# Patient Record
Sex: Male | Born: 1985 | Race: Black or African American | Hispanic: No | Marital: Single | Smoking: Never smoker
Health system: Southern US, Community
[De-identification: ages and names within clinical notes are randomized; demographics above are authoritative.]

## PROBLEM LIST (undated history)

## (undated) DIAGNOSIS — Z8669 Personal history of other diseases of the nervous system and sense organs: Secondary | ICD-10-CM

## (undated) HISTORY — DX: Personal history of other diseases of the nervous system and sense organs: Z86.69

---

## 2017-04-17 ENCOUNTER — Emergency Department (HOSPITAL_COMMUNITY): Payer: Self-pay

## 2017-04-17 ENCOUNTER — Emergency Department (HOSPITAL_COMMUNITY)
Admission: EM | Admit: 2017-04-17 | Discharge: 2017-04-17 | Disposition: A | Discharge status: Home / Self Care | Payer: Self-pay | Attending: Physician Assistant | Admitting: Physician Assistant

## 2017-04-17 ENCOUNTER — Encounter (HOSPITAL_COMMUNITY): Payer: Self-pay | Admitting: Emergency Medicine

## 2017-04-17 DIAGNOSIS — N200 Calculus of kidney: Secondary | ICD-10-CM | POA: Insufficient documentation

## 2017-04-17 LAB — URINALYSIS, ROUTINE W REFLEX MICROSCOPIC
BACTERIA UA: NONE SEEN
BILIRUBIN URINE: NEGATIVE
GLUCOSE, UA: NEGATIVE mg/dL
KETONES UR: NEGATIVE mg/dL
NITRITE: NEGATIVE
PROTEIN: 100 mg/dL — AB
Specific Gravity, Urine: 1.03 (ref 1.005–1.030)
pH: 5 (ref 5.0–8.0)

## 2017-04-17 LAB — COMPREHENSIVE METABOLIC PANEL
ALBUMIN: 4.3 g/dL (ref 3.5–5.0)
ALT: 18 U/L (ref 17–63)
ANION GAP: 9 (ref 5–15)
AST: 23 U/L (ref 15–41)
Alkaline Phosphatase: 67 U/L (ref 38–126)
BUN: 13 mg/dL (ref 6–20)
CO2: 24 mmol/L (ref 22–32)
Calcium: 9.2 mg/dL (ref 8.9–10.3)
Chloride: 106 mmol/L (ref 101–111)
Creatinine, Ser: 1.31 mg/dL — ABNORMAL HIGH (ref 0.61–1.24)
GFR calc non Af Amer: >60 mL/min (ref >60)
GLUCOSE: 142 mg/dL — AB (ref 65–99)
POTASSIUM: 3.5 mmol/L (ref 3.5–5.1)
SODIUM: 139 mmol/L (ref 135–145)
TOTAL PROTEIN: 7.6 g/dL (ref 6.5–8.1)
Total Bilirubin: 0.8 mg/dL (ref 0.3–1.2)

## 2017-04-17 LAB — CBC
HEMATOCRIT: 42.9 % (ref 39.0–52.0)
HEMOGLOBIN: 14.6 g/dL (ref 13.0–17.0)
MCH: 32.2 pg (ref 26.0–34.0)
MCHC: 34 g/dL (ref 30.0–36.0)
MCV: 94.7 fL (ref 78.0–100.0)
Platelets: 214 10*3/uL (ref 150–400)
RBC: 4.53 MIL/uL (ref 4.22–5.81)
RDW: 12.5 % (ref 11.5–15.5)
WBC: 13.4 10*3/uL — ABNORMAL HIGH (ref 4.0–10.5)

## 2017-04-17 LAB — LIPASE, BLOOD: Lipase: 25 U/L (ref 11–51)

## 2017-04-17 MED ORDER — ONDANSETRON 4 MG PO TBDP: 4.0000 mg | ORAL_TABLET | Freq: Three times a day (TID) | ORAL | 0 refills | Status: DC | PRN

## 2017-04-17 MED ORDER — ONDANSETRON HCL 4 MG/2ML IJ SOLN
4.0000 mg | Freq: Once | INTRAMUSCULAR | Status: AC
  Administered 2017-04-17: 4 mg via INTRAVENOUS
  Filled 2017-04-17: qty 2

## 2017-04-17 MED ORDER — HYDROCODONE-ACETAMINOPHEN 5-325 MG PO TABS: ORAL_TABLET | ORAL | 0 refills | Status: DC

## 2017-04-17 MED ORDER — SODIUM CHLORIDE 0.9 % IV BOLUS (SEPSIS)
1000.0000 mL | Freq: Once | INTRAVENOUS | Status: AC
  Administered 2017-04-17: 1000 mL via INTRAVENOUS

## 2017-04-17 MED ORDER — OXYCODONE-ACETAMINOPHEN 5-325 MG PO TABS
ORAL_TABLET | ORAL | Status: AC
  Filled 2017-04-17: qty 1

## 2017-04-17 MED ORDER — KETOROLAC TROMETHAMINE 15 MG/ML IJ SOLN
15.0000 mg | Freq: Once | INTRAMUSCULAR | Status: AC
  Administered 2017-04-17: 15 mg via INTRAVENOUS
  Filled 2017-04-17: qty 1

## 2017-04-17 MED ORDER — NAPROXEN 500 MG PO TABS: 500.0000 mg | ORAL_TABLET | Freq: Two times a day (BID) | ORAL | 0 refills | Status: DC

## 2017-04-17 MED ORDER — OXYCODONE-ACETAMINOPHEN 5-325 MG PO TABS
1.0000 | ORAL_TABLET | ORAL | Status: DC | PRN
  Administered 2017-04-17: 1 via ORAL

## 2017-04-17 MED ORDER — HYDROMORPHONE HCL 1 MG/ML IJ SOLN
1.0000 mg | Freq: Once | INTRAMUSCULAR | Status: AC
  Administered 2017-04-17: 1 mg via INTRAVENOUS
  Filled 2017-04-17: qty 1

## 2017-04-17 NOTE — ED Provider Notes (Signed)
[facility]-EMERGENCY DEPT Provider Note   CSN: [id number] Arrival date & time: 04/17/17  1551   By signing my name below, I, Clarisse Gouge, attest that this documentation has been prepared under the direction and in the presence of Josh Whitfield Dulay PA-C. Electronically Signed: Clarisse Gouge, Scribe. 04/17/17. 5:58 PM.  History   Chief Complaint Chief Complaint  Patient presents with  . Back Pain  . Abdominal Pain  . Testicle Pain   The history is provided by the patient and medical records. No language interpreter was used.    Jake [name] is a 31 y.o. male presenting to the Emergency Department concerning R back pain that became severe this morning. He states he has had the pain x 1 month intermittently, but not as severe as presenting pain. Lower abdominal pain, bilateral testicle pain, vomiting x 1 PTA and x 3 in the waiting room associated. He describes 10/10, constant, gradually worsening back pain. Pt states he has not eaten today. Gallbladder and abdominal organs intact. Pt states he was evaluated by a GI specialist for gastritis ~1 year ago. Social alcohol use. No SOB today, thought he reports one episode 2-3 days ago. No fever. No other complaints at this time.   History reviewed. No pertinent past medical history.  There are no active problems to display for this patient.   History reviewed. No pertinent surgical history.     Home Medications    Prior to Admission medications   Medication Sig Start Date End Date Taking? Authorizing Provider  HYDROcodone-acetaminophen (NORCO/VICODIN) 5-325 MG tablet Take 1-2 tablets every 6 hours as needed for severe pain 04/17/17   Renne Crigler, PA-C  naproxen (NAPROSYN) 500 MG tablet Take 1 tablet (500 mg total) by mouth 2 (two) times daily. 04/17/17   Renne Crigler, PA-C  ondansetron (ZOFRAN ODT) 4 MG disintegrating tablet Take 1 tablet (4 mg total) by mouth every 8 (eight) hours as needed for nausea or vomiting. 04/17/17   Renne Crigler, PA-C     Family History No family history on file.  Social History Social History  Substance Use Topics  . Smoking status: Never Smoker  . Smokeless tobacco: Never Used  . Alcohol use Yes     Allergies   Patient has no known allergies.   Review of Systems Review of Systems  Constitutional: Negative for fever.  Respiratory: Negative for shortness of breath.   Gastrointestinal: Positive for abdominal pain.  Genitourinary: Positive for scrotal swelling and testicular pain. Negative for difficulty urinating, dysuria and hematuria.  Musculoskeletal: Positive for arthralgias, back pain and myalgias.  Skin: Positive for color change. Negative for rash and wound.  All other systems reviewed and are negative.    Physical Exam Updated Vital Signs BP (!) 138/92   Pulse 85   Temp 97.8 F (36.6 C) (Oral)   Resp 18   Ht 5' (1.524 m)   Wt 160 lb (72.6 kg)   SpO2 100%   BMI 31.25 kg/m   Physical Exam  Constitutional: He appears well-developed and well-nourished.  HENT:  Head: Normocephalic and atraumatic.  Mouth/Throat: Oropharynx is clear and moist.  Eyes: Conjunctivae are normal. Right eye exhibits no discharge. Left eye exhibits no discharge.  Neck: Normal range of motion. Neck supple.  Cardiovascular: Normal rate, regular rhythm and normal heart sounds.   Pulmonary/Chest: Effort normal and breath sounds normal. He has no wheezes. He has no rales.  Abdominal: Soft. There is tenderness (mild, lower abd). There is no rebound and no guarding.  Genitourinary: Penis normal. Right testis shows no mass, no swelling and no tenderness. Left testis shows no mass, no swelling and no tenderness. No penile erythema.  Neurological: He is alert.  Skin: Skin is warm and dry.  Psychiatric: He has a normal mood and affect.  Nursing note and vitals reviewed.    ED Treatments / Results  DIAGNOSTIC STUDIES: Oxygen Saturation is 100% on RA, NL by my interpretation.    COORDINATION OF  CARE: 5:52 PM-Discussed next steps with pt. Pt verbalized understanding and is agreeable with the plan. Will order CT scan and UA.   Labs (all labs ordered are listed, but only abnormal results are displayed) Labs Reviewed  COMPREHENSIVE METABOLIC PANEL - Abnormal; Notable for the following:       Result Value   Glucose, Bld 142 (*)    Creatinine, Ser 1.31 (*)    All other components within normal limits  CBC - Abnormal; Notable for the following:    WBC 13.4 (*)    All other components within normal limits  URINALYSIS, ROUTINE W REFLEX MICROSCOPIC - Abnormal; Notable for the following:    APPearance CLOUDY (*)    Hgb urine dipstick LARGE (*)    Protein, ur 100 (*)    Leukocytes, UA SMALL (*)    Squamous Epithelial / LPF 0-5 (*)    All other components within normal limits  LIPASE, BLOOD    Radiology Ct Renal Stone Study  Result Date: 04/17/2017 CLINICAL DATA:  Back pain for a month radiating into the right flank. EXAM: CT ABDOMEN AND PELVIS WITHOUT CONTRAST TECHNIQUE: Multidetector CT imaging of the abdomen and pelvis was performed following the standard protocol without IV contrast. COMPARISON:  None. FINDINGS: Lower chest: No acute abnormality. Hepatobiliary: A few small cysts are seen in the right hepatic lobe. The liver and gallbladder are otherwise within normal limits. Pancreas: Unremarkable. No pancreatic ductal dilatation or surrounding inflammatory changes. Spleen: Normal in size without focal abnormality. Adrenals/Urinary Tract: The adrenal glands are normal. No renal stones or perinephric stranding identified. There is mild hydronephrosis on the right. The right ureter is prominent compared to the left as well with some periureteral stranding distally. There is a stone in the right posterior bladder consistent with a recently passed stone. The bladder is decompressed but otherwise unremarkable. Stomach/Bowel: Stomach is within normal limits. Appendix appears normal. No evidence  of bowel wall thickening, distention, or inflammatory changes. Vascular/Lymphatic: No significant vascular findings are present. No enlarged abdominal or pelvic lymph nodes. Reproductive: Prostate is unremarkable. Other: There is a small fat containing umbilical hernia. No other abnormalities. Musculoskeletal: No acute or significant osseous findings. IMPRESSION: 1. There is a stone in the right side of the bladder with mild right hydronephrosis, ureterectasis, and periureteral stranding consistent with a recently passed stone. Electronically Signed   By: Gerome Samavid  Williams III M.D   On: 04/17/2017 18:40    Procedures Procedures (including critical care time)  Medications Ordered in ED Medications  oxyCODONE-acetaminophen (PERCOCET/ROXICET) 5-325 MG per tablet 1 tablet (1 tablet Oral Given 04/17/17 1603)     Initial Impression / Assessment and Plan / ED Course  I have reviewed the triage vital signs and the nursing notes.  Pertinent labs & imaging results that were available during my care of the patient were reviewed by me and considered in my medical decision making (see chart for details).     Patient seen and examined.   Vital signs reviewed and are as follows: BP 118/75 (  BP Location: Right Arm)   Pulse 80   Temp 97.8 F (36.6 C) (Oral)   Resp 16   Ht 5' (1.524 m)   Wt 72.6 kg (160 lb)   SpO2 100%   BMI 31.25 kg/m   CT demonstrates right-sided hydronephrosis with stone in the bladder, likely recently passed. The patient's pain is well-controlled at the current time. No vomiting while in emergency department.  Patient counseled on kidney stone treatment. Urged patient to strain urine and save any stones. Urged urology follow-up and return to [facility] with any complications. Counseled patient to maintain good fluid intake.   Counseled patient on use of Flomax.   Patient counseled on use of narcotic pain medications. Counseled not to combine these medications with others containing  tylenol. Urged not to drink alcohol, drive, or perform any other activities that requires focus while taking these medications. The patient verbalizes understanding and agrees with the plan.   Final Clinical Impressions(s) / ED Diagnoses   Final diagnoses:  Kidney stone   Patient with signs consistent with right ureteral colic, CT shows passed stone. Patient's pain is improved and well-controlled in the ED. No urinary tract infection. Renal function just slightly above normal range. Patient well-appearing and stable for discharge to home with urology follow-up.  New Prescriptions New Prescriptions   HYDROCODONE-ACETAMINOPHEN (NORCO/VICODIN) 5-325 MG TABLET    Take 1-2 tablets every 6 hours as needed for severe pain   NAPROXEN (NAPROSYN) 500 MG TABLET    Take 1 tablet (500 mg total) by mouth 2 (two) times daily.   ONDANSETRON (ZOFRAN ODT) 4 MG DISINTEGRATING TABLET    Take 1 tablet (4 mg total) by mouth every 8 (eight) hours as needed for nausea or vomiting.  I personally performed the services described in this documentation, which was scribed in my presence. The recorded information has been reviewed and is accurate.    Renne Crigler, PA-C 04/17/17 1917    Abelino Derrick, MD 04/18/17 1920

## 2017-04-17 NOTE — Discharge Instructions (Signed)
Please read and follow all provided instructions.  Your diagnoses today include:  1. Kidney stone     Tests performed today include:  Urine test that showed blood in your urine and no infection  CT scan which showed a passed kidney stone on the right side  Blood test that showed normal kidney function  Vital signs. See below for your results today.   Medications prescribed:   Percocet (oxycodone/acetaminophen) - narcotic pain medication  DO NOT drive or perform any activities that require you to be awake and alert because this medicine can make you drowsy. BE VERY CAREFUL not to take multiple medicines containing Tylenol (also called acetaminophen). Doing so can lead to an overdose which can damage your liver and cause liver failure and possibly death.   Naproxen - anti-inflammatory pain medication  Do not exceed 500mg  naproxen every 12 hours, take with food  You have been prescribed an anti-inflammatory medication or NSAID. Take with food. Take smallest effective dose for the shortest duration needed for your pain. Stop taking if you experience stomach pain or vomiting.    Zofran (ondansetron) - for nausea and vomiting  Take any prescribed medications only as directed.  Home care instructions:  Follow any educational materials contained in this packet.  Please double your fluid intake for the next several days. Strain your urine and save any stones that may pass.   BE VERY CAREFUL not to take multiple medicines containing Tylenol (also called acetaminophen). Doing so can lead to an overdose which can damage your liver and cause liver failure and possibly death.   Follow-up instructions: Please follow-up with your urologist or the urologist referral (provided on front page) in the next 1 week for further evaluation of your symptoms.  If you need to return to the Emergency Department, go to [facility] and not [facility]. The [facility]rologists are located at  [facility] and can better care for you at this location.  Return instructions:  If you need to return to the Emergency Department, [facility] and [facility] . The [facility]rologists are located at [facility] and can better care for you at this location.   Please return to the Emergency Department if you experience worsening symptoms.  Please return if you develop fever or uncontrolled pain or vomiting.  Please return if you have any other emergent concerns.  Additional Information:  Your vital signs today were: BP 118/75 (BP Location: Right Arm)    Pulse 80    Temp 97.8 F (36.6 C) (Oral)    Resp 16    Ht 5' (1.524 m)    Wt 72.6 kg (160 lb)    SpO2 100%    BMI 31.25 kg/m  If your blood pressure (BP) was elevated above 135/85 this visit, please have this repeated by your doctor within one month. --------------

## 2017-04-17 NOTE — ED Triage Notes (Signed)
Pt c/o back pain for more than a week. Yesterday pt began to experience lower abdominal pain and B/L testicle pain. Pt reports vomiting onset today. Pt remembers eating raw chicken yesterday. Pt also reports blood in urine.

## 2020-07-23 ENCOUNTER — Ambulatory Visit (INDEPENDENT_AMBULATORY_CARE_PROVIDER_SITE_OTHER): Payer: BC Managed Care – PPO | Admitting: Podiatry

## 2020-07-23 ENCOUNTER — Ambulatory Visit (INDEPENDENT_AMBULATORY_CARE_PROVIDER_SITE_OTHER): Payer: BC Managed Care – PPO

## 2020-07-23 ENCOUNTER — Ambulatory Visit: Payer: BC Managed Care – PPO

## 2020-07-23 ENCOUNTER — Other Ambulatory Visit: Payer: Self-pay

## 2020-07-23 DIAGNOSIS — S9031XA Contusion of right foot, initial encounter: Secondary | ICD-10-CM

## 2020-07-23 DIAGNOSIS — M216X2 Other acquired deformities of left foot: Secondary | ICD-10-CM

## 2020-07-23 DIAGNOSIS — M79671 Pain in right foot: Secondary | ICD-10-CM

## 2020-07-23 DIAGNOSIS — M79672 Pain in left foot: Secondary | ICD-10-CM

## 2020-07-23 DIAGNOSIS — S9032XA Contusion of left foot, initial encounter: Secondary | ICD-10-CM

## 2020-07-23 MED ORDER — DICLOFENAC SODIUM 75 MG PO TBEC: 75.0000 mg | DELAYED_RELEASE_TABLET | Freq: Two times a day (BID) | ORAL | 1 refills | Status: DC
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
## 2020-08-07 ENCOUNTER — Ambulatory Visit: Payer: BC Managed Care – PPO | Admitting: Orthotics

## 2020-08-07 ENCOUNTER — Other Ambulatory Visit: Payer: Self-pay

## 2020-08-07 DIAGNOSIS — M79671 Pain in right foot: Secondary | ICD-10-CM

## 2020-08-07 DIAGNOSIS — M216X2 Other acquired deformities of left foot: Secondary | ICD-10-CM

## 2020-08-07 DIAGNOSIS — M79672 Pain in left foot: Secondary | ICD-10-CM

## 2020-08-07 NOTE — Progress Notes (Signed)
Cast today for skew foot

## 2020-09-08 ENCOUNTER — Ambulatory Visit (INDEPENDENT_AMBULATORY_CARE_PROVIDER_SITE_OTHER): Payer: BC Managed Care – PPO | Admitting: Orthotics

## 2020-09-08 ENCOUNTER — Other Ambulatory Visit: Payer: Self-pay

## 2020-09-08 DIAGNOSIS — M216X2 Other acquired deformities of left foot: Secondary | ICD-10-CM

## 2020-09-08 DIAGNOSIS — M79671 Pain in right foot: Secondary | ICD-10-CM

## 2020-09-08 NOTE — Progress Notes (Signed)
Patient came in today to pick up custom made foot orthotics.  The goals were accomplished and the patient reported no dissatisfaction with said orthotics.  Patient was advised of breakin period and how to report any issues. 

## 2020-11-30 ENCOUNTER — Emergency Department (HOSPITAL_COMMUNITY): Payer: BC Managed Care – PPO

## 2020-11-30 ENCOUNTER — Encounter (HOSPITAL_COMMUNITY): Payer: Self-pay | Admitting: Emergency Medicine

## 2020-11-30 ENCOUNTER — Other Ambulatory Visit: Payer: Self-pay

## 2020-11-30 ENCOUNTER — Emergency Department (HOSPITAL_COMMUNITY)
Admission: EM | Admit: 2020-11-30 | Discharge: 2020-11-30 | Disposition: A | Discharge status: Home / Self Care | Payer: BC Managed Care – PPO | Attending: Emergency Medicine | Admitting: Emergency Medicine

## 2020-11-30 DIAGNOSIS — R0789 Other chest pain: Secondary | ICD-10-CM | POA: Insufficient documentation

## 2020-11-30 DIAGNOSIS — R0602 Shortness of breath: Secondary | ICD-10-CM | POA: Diagnosis not present

## 2020-11-30 LAB — CBC
HCT: 42.1 % (ref 39.0–52.0)
Hemoglobin: 13.7 g/dL (ref 13.0–17.0)
MCH: 32.1 pg (ref 26.0–34.0)
MCHC: 32.5 g/dL (ref 30.0–36.0)
MCV: 98.6 fL (ref 80.0–100.0)
Platelets: 213 10*3/uL (ref 150–400)
RBC: 4.27 MIL/uL (ref 4.22–5.81)
RDW: 12.6 % (ref 11.5–15.5)
WBC: 7.7 10*3/uL (ref 4.0–10.5)
nRBC: 0 % (ref 0.0–0.2)

## 2020-11-30 LAB — BASIC METABOLIC PANEL
Anion gap: 9 (ref 5–15)
BUN: 11 mg/dL (ref 6–20)
CO2: 24 mmol/L (ref 22–32)
Calcium: 9 mg/dL (ref 8.9–10.3)
Chloride: 105 mmol/L (ref 98–111)
Creatinine, Ser: 1.16 mg/dL (ref 0.61–1.24)
GFR, Estimated: >60 mL/min (ref >60)
Glucose, Bld: 70 mg/dL (ref 70–99)
Potassium: 4 mmol/L (ref 3.5–5.1)
Sodium: 138 mmol/L (ref 135–145)

## 2020-11-30 LAB — TROPONIN I (HIGH SENSITIVITY)
Troponin I (High Sensitivity): 5 ng/L (ref <18)
Troponin I (High Sensitivity): 6 ng/L (ref <18)

## 2020-11-30 MED ORDER — LIDOCAINE VISCOUS HCL 2 % MT SOLN
15.0000 mL | Freq: Once | OROMUCOSAL | Status: AC
  Administered 2020-11-30: 15 mL via ORAL
  Filled 2020-11-30: qty 15

## 2020-11-30 MED ORDER — ALUM & MAG HYDROXIDE-SIMETH 200-200-20 MG/5ML PO SUSP
30.0000 mL | Freq: Once | ORAL | Status: AC
  Administered 2020-11-30: 30 mL via ORAL
  Filled 2020-11-30: qty 30

## 2020-11-30 NOTE — Discharge Instructions (Signed)
As we discussed today your work-up was reassuring. I suspect that your symptoms are due to a combination of muscle/chest wall pain along with some acid reflux. Please start taking Prilosec which is available over-the-counter to see if this helps. Additionally will I would normally recommend you take NSAIDs like ibuprofen for your chest wall pain I would recommend avoiding these as they can further irritate your stomach. You can use Voltaren gel on the area which is available over-the-counter.

## 2020-11-30 NOTE — ED Triage Notes (Signed)
Pt reports intermittent pain across chest and arms x 1 week with SOB.  States he was grocery shopping PTA and had SOB.  Denies nausea and vomiting.

## 2020-11-30 NOTE — ED Provider Notes (Signed)
[facility] EMERGENCY DEPARTMENT Provider Note   CSN: [id number] Arrival date & time: 11/30/20  1745     History Chief Complaint  Patient presents with  . Chest Pain    Jake [name] is a 35 y.o. male with no pertinent past medical history who presents today for evaluation of constant pain across his chest for 1 week. He states that about a week ago he started having right-sided chest pain.  He then had pains down his right arm.  He states that he has had this pain wax and wane since it started.  He denies any new activities or other clear cause for onset of his pain.  He says that it does not relate to eating however feels like he has burning in his throat.  He states that in the past he has had similar, he went to an urgent care where they got an EKG and then sent him home.  He denies any nausea or vomiting.  He does report that he had one episode for about 10 minutes when he felt burning in his throat and had associated shortness of breath.  This resolved without specific intervention.  He states that his chest pain worsens with movement, and he has noticed that when he moves his arms that will also worsen the pain.  While the pain originally started in the right side of his chest it is now in both sides. The pain is sharp. Patient is adopted therefore does not know of any family history of cardiovascular risk.  HPI    History reviewed. No pertinent past medical history.  There are no problems to display for this patient.   History reviewed. No pertinent surgical history.     No family history on file.  Social History   Tobacco Use  . Smoking status: Never Smoker  . Smokeless tobacco: Never Used  Substance Use Topics  . Alcohol use: Yes  . Drug use: No    Home Medications Prior to Admission medications   Medication Sig Start Date End Date Taking? Authorizing Provider  caffeine 200 MG TABS tablet Take 200 mg by mouth every 4 (four) hours as  needed (weight  loss).   Yes [provider]  diclofenac (VOLTAREN) 75 MG EC tablet Take 1 tablet (75 mg total) by mouth 2 (two) times daily. Patient not taking: Reported on 11/30/2020 07/23/20   Felecia Shelling, DPM  HYDROcodone-acetaminophen (NORCO/VICODIN) 5-325 MG tablet Take 1-2 tablets every 6 hours as needed for severe pain Patient not taking: Reported on 11/30/2020 04/17/17   Renne Crigler, PA-C  naproxen (NAPROSYN) 500 MG tablet Take 1 tablet (500 mg total) by mouth 2 (two) times daily. Patient not taking: Reported on 11/30/2020 04/17/17   Renne Crigler, PA-C  ondansetron (ZOFRAN ODT) 4 MG disintegrating tablet Take 1 tablet (4 mg total) by mouth every 8 (eight) hours as needed for nausea or vomiting. Patient not taking: Reported on 11/30/2020 04/17/17   Renne Crigler, PA-C    Allergies    Patient has no known allergies.  Review of Systems   Review of Systems  Constitutional: Negative for chills, fatigue, fever and unexpected weight change.  HENT: Negative for congestion.   Respiratory: Positive for shortness of breath.   Cardiovascular: Positive for chest pain. Negative for palpitations and leg swelling.  Gastrointestinal: Negative for abdominal pain, diarrhea, nausea and vomiting.  Genitourinary: Negative for dysuria.  Musculoskeletal: Negative for back pain.  Skin: Negative for color change and rash.  Neurological: Negative for dizziness, weakness and light-headedness.  Psychiatric/Behavioral: Negative for confusion.    Physical Exam Updated Vital Signs BP 112/79   Pulse 68   Temp 98.2 F (36.8 C) (Oral)   Resp 15   SpO2 97%   Physical Exam Vitals and nursing note reviewed.  Constitutional:      General: He is not in acute distress.    Appearance: He is not diaphoretic.  HENT:     Head: Normocephalic and atraumatic.  Eyes:     General: No scleral icterus.       Right eye: No discharge.        Left eye: No discharge.     Conjunctiva/sclera: Conjunctivae  normal.  Cardiovascular:     Rate and Rhythm: Normal rate and regular rhythm.     Pulses:          Radial pulses are 2+ on the right side and 2+ on the left side.       Posterior tibial pulses are 2+ on the right side and 2+ on the left side.     Heart sounds: Normal heart sounds. No murmur heard.   Pulmonary:     Effort: Pulmonary effort is normal. No respiratory distress.     Breath sounds: Normal breath sounds. No stridor.  Chest:     Chest wall: Tenderness (There is tenderness to palpation over sternocostal joints bilaterally primarily in the upper chest.) present.  Abdominal:     General: There is no distension.     Palpations: Abdomen is soft.     Tenderness: There is no abdominal tenderness. There is no guarding.  Musculoskeletal:        General: No deformity.     Cervical back: Normal range of motion.     Comments: Movement of bilateral arms worsens his chest pain specifically any movement that would put stretch on the chest muscles worsens his pain.   Bilateral lower extremities without edema or tenderness.  Skin:    General: Skin is warm and dry.  Neurological:     Mental Status: He is alert.     Motor: No abnormal muscle tone.  Psychiatric:        Behavior: Behavior normal.     ED Results / Procedures / Treatments   Labs (all labs ordered are listed, but only abnormal results are displayed) Labs Reviewed  BASIC METABOLIC PANEL  CBC  TROPONIN I (HIGH SENSITIVITY)  TROPONIN I (HIGH SENSITIVITY)    EKG EKG Interpretation  Date/Time:  Sunday November 30 2020 17:52:09 EST Ventricular Rate:  109 PR Interval:  134 QRS Duration: 78 QT Interval:  334 QTC Calculation: 449 R Axis:   95 Text Interpretation: Sinus tachycardia Rightward axis Borderline ECG No old tracing to compare Confirmed by Rolan Bucco [phone]) on 11/30/2020 10:14:31 PM   Radiology DG Chest 2 View  Result Date: 11/30/2020 CLINICAL DATA:  Mid chest pain. EXAM: CHEST - 2 VIEW COMPARISON:   None. FINDINGS: The heart size and mediastinal contours are within normal limits. Both lungs are clear. The visualized skeletal structures are unremarkable. IMPRESSION: No active cardiopulmonary disease. Electronically Signed   By: Gerome Sam III M.D   On: 11/30/2020 18:24    Procedures Procedures   Medications Ordered in ED Medications  alum & mag hydroxide-simeth (MAALOX/MYLANTA) 200-200-20 MG/5ML suspension 30 mL (30 mLs Oral Given 11/30/20 2218)    And  lidocaine (XYLOCAINE) 2 % viscous mouth solution 15 mL (15 mLs Oral Given 11/30/20 2218)  ED Course  I have reviewed the triage vital signs and the nursing notes.  Pertinent labs & imaging results that were available during my care of the patient were reviewed by me and considered in my medical decision making (see chart for details).    MDM Rules/Calculators/A&P HEAR Score: 0                       Patient is a 35 year old man who presents today for evaluation of 1 week of waxing and waning chest pain.  He denies any personal history of cardiac concerns, and is adopted and therefore does not know his family risk factors.  He does not smoke.  His pain is worsened with movements of the arms and his recreatable with palpation over the anterior chest.    EKG without ischemia.  Troponin x2 is not elevated.  Chest x-ray is reassuring.  CBC and BMP are normal.  Patient was treated with GI cocktail of Maalox and lidocaine, after which he reported feeling better.  I suspect that his pain is a combination of musculoskeletal chest wall pain and GERD.  We discussed treatment for both of these.  His heart score is 0, doubt ACS.  Return precautions were discussed with patient who states their understanding.  At the time of discharge patient denied any unaddressed complaints or concerns.  Patient is agreeable for discharge home.  Note: Portions of this report may have been transcribed using voice recognition software. Every effort was made to  ensure accuracy; however, inadvertent computerized transcription errors may be present  Final Clinical Impression(s) / ED Diagnoses Final diagnoses:  Atypical chest pain    Rx / DC Orders ED Discharge Orders    None       Cristina Gong, PA-C 12/01/20 0003    Rolan Bucco, MD 12/03/20 [phone]

## 2021-07-15 ENCOUNTER — Emergency Department (HOSPITAL_COMMUNITY): Payer: BC Managed Care – PPO

## 2021-07-15 ENCOUNTER — Other Ambulatory Visit: Payer: Self-pay

## 2021-07-15 ENCOUNTER — Encounter (HOSPITAL_COMMUNITY): Payer: Self-pay

## 2021-07-15 ENCOUNTER — Emergency Department (HOSPITAL_COMMUNITY)
Admission: EM | Admit: 2021-07-15 | Discharge: 2021-07-15 | Disposition: A | Discharge status: Home / Self Care | Payer: BC Managed Care – PPO | Attending: Emergency Medicine | Admitting: Emergency Medicine

## 2021-07-15 DIAGNOSIS — G43809 Other migraine, not intractable, without status migrainosus: Secondary | ICD-10-CM | POA: Insufficient documentation

## 2021-07-15 DIAGNOSIS — H538 Other visual disturbances: Secondary | ICD-10-CM | POA: Diagnosis not present

## 2021-07-15 DIAGNOSIS — M542 Cervicalgia: Secondary | ICD-10-CM | POA: Diagnosis not present

## 2021-07-15 DIAGNOSIS — R519 Headache, unspecified: Secondary | ICD-10-CM | POA: Diagnosis present

## 2021-07-15 LAB — CBC
HCT: 42.3 % (ref 39.0–52.0)
Hemoglobin: 14.1 g/dL (ref 13.0–17.0)
MCH: 32.6 pg (ref 26.0–34.0)
MCHC: 33.3 g/dL (ref 30.0–36.0)
MCV: 97.7 fL (ref 80.0–100.0)
Platelets: 212 10*3/uL (ref 150–400)
RBC: 4.33 MIL/uL (ref 4.22–5.81)
RDW: 12.5 % (ref 11.5–15.5)
WBC: 5.2 10*3/uL (ref 4.0–10.5)
nRBC: 0 % (ref 0.0–0.2)

## 2021-07-15 LAB — BASIC METABOLIC PANEL
Anion gap: 9 (ref 5–15)
BUN: 12 mg/dL (ref 6–20)
CO2: 25 mmol/L (ref 22–32)
Calcium: 9.1 mg/dL (ref 8.9–10.3)
Chloride: 103 mmol/L (ref 98–111)
Creatinine, Ser: 1.03 mg/dL (ref 0.61–1.24)
GFR, Estimated: >60 mL/min (ref >60)
Glucose, Bld: 99 mg/dL (ref 70–99)
Potassium: 3.8 mmol/L (ref 3.5–5.1)
Sodium: 137 mmol/L (ref 135–145)

## 2021-07-15 MED ORDER — METOCLOPRAMIDE HCL 5 MG/ML IJ SOLN
10.0000 mg | Freq: Once | INTRAMUSCULAR | Status: AC
  Administered 2021-07-15: 10 mg via INTRAVENOUS
  Filled 2021-07-15: qty 2

## 2021-07-15 MED ORDER — DIPHENHYDRAMINE HCL 50 MG/ML IJ SOLN
25.0000 mg | Freq: Once | INTRAMUSCULAR | Status: AC
  Administered 2021-07-15: 25 mg via INTRAVENOUS
  Filled 2021-07-15: qty 1

## 2021-07-15 MED ORDER — ACETAMINOPHEN 500 MG PO TABS
1000.0000 mg | ORAL_TABLET | Freq: Once | ORAL | Status: AC
  Administered 2021-07-15: 1000 mg via ORAL
  Filled 2021-07-15: qty 2

## 2021-07-15 MED ORDER — SODIUM CHLORIDE 0.9 % IV BOLUS
1000.0000 mL | Freq: Once | INTRAVENOUS | Status: AC
  Administered 2021-07-15: 1000 mL via INTRAVENOUS

## 2021-07-15 NOTE — ED Notes (Signed)
Patient Alert and oriented to baseline. Stable and ambulatory to baseline. Patient verbalized understanding of the discharge instructions.  Patient belongings were taken by the patient.   

## 2021-07-15 NOTE — ED Provider Notes (Signed)
[facility] EMERGENCY DEPARTMENT Provider Note   CSN: [id number] Arrival date & time: 07/15/21  4098     History Chief Complaint  Patient presents with   Headache    Jake [name] is a 35 y.o. male with a past medical history of cervicogenic headaches presenting to the ED for headache.  States that this headache has been gradually worsening since yesterday.  Pain begins in his neck and gradually radiates up.  Worse with movement and palpation.  Reports blurry vision when he has a headache that has been going on for the past 2 weeks.  Denying any numbness in arms or legs.  He last took Tylenol yesterday around 8 PM with only minimal improvement.  He has also tried massaging his neck area and applying heat with only minimal improvement.  Denies any prior neck surgeries, personal family history of aneurysms, injuries or falls, fever, rash, abdominal pain, vomiting, chest pain.  HPI     History reviewed. No pertinent past medical history.  There are no problems to display for this patient.   History reviewed. No pertinent surgical history.     No family history on file.  Social History   Tobacco Use   Smoking status: Never   Smokeless tobacco: Never  Substance Use Topics   Alcohol use: Yes   Drug use: No    Home Medications Prior to Admission medications   Medication Sig Start Date End Date Taking? Authorizing Provider  caffeine 200 MG TABS tablet Take 200 mg by mouth every 4 (four) hours as needed (weight  loss).    [provider]  diclofenac (VOLTAREN) 75 MG EC tablet Take 1 tablet (75 mg total) by mouth 2 (two) times daily. Patient not taking: Reported on 11/30/2020 07/23/20   Felecia Shelling, DPM  HYDROcodone-acetaminophen (NORCO/VICODIN) 5-325 MG tablet Take 1-2 tablets every 6 hours as needed for severe pain Patient not taking: Reported on 11/30/2020 04/17/17   Renne Crigler, PA-C  naproxen (NAPROSYN) 500 MG tablet Take 1 tablet (500 mg  total) by mouth 2 (two) times daily. Patient not taking: Reported on 11/30/2020 04/17/17   Renne Crigler, PA-C  ondansetron (ZOFRAN ODT) 4 MG disintegrating tablet Take 1 tablet (4 mg total) by mouth every 8 (eight) hours as needed for nausea or vomiting. Patient not taking: Reported on 11/30/2020 04/17/17   Renne Crigler, PA-C    Allergies    Patient has no known allergies.  Review of Systems   Review of Systems  Constitutional:  Negative for appetite change, chills and fever.  HENT:  Negative for ear pain, rhinorrhea, sneezing and sore throat.   Eyes:  Negative for photophobia and visual disturbance.  Respiratory:  Negative for cough, chest tightness, shortness of breath and wheezing.   Cardiovascular:  Negative for chest pain and palpitations.  Gastrointestinal:  Negative for abdominal pain, blood in stool, constipation, diarrhea, nausea and vomiting.  Genitourinary:  Negative for dysuria, hematuria and urgency.  Musculoskeletal:  Positive for myalgias and neck pain.  Skin:  Negative for rash.  Neurological:  Positive for headaches. Negative for dizziness, weakness and light-headedness.   Physical Exam Updated Vital Signs BP 121/81   Pulse 70   Temp 98.8 F (37.1 C) (Oral)   Resp 18   SpO2 100%   Physical Exam Vitals and nursing note reviewed.  Constitutional:      General: He is not in acute distress.    Appearance: He is well-developed.  HENT:  Head: Normocephalic and atraumatic.     Nose: Nose normal.  Eyes:     General: No scleral icterus.       Right eye: No discharge.        Left eye: No discharge.     Conjunctiva/sclera: Conjunctivae normal.     Pupils: Pupils are equal, round, and reactive to light.  Neck:      Comments: Tenderness palpation of the cervical spine and paraspinal musculature bilaterallyNo step-off palpated. No visible bruising, edema or temperature change noted.  No meningismus. Cardiovascular:     Rate and Rhythm: Normal rate and regular  rhythm.     Heart sounds: Normal heart sounds. No murmur heard.   No friction rub. No gallop.  Pulmonary:     Effort: Pulmonary effort is normal. No respiratory distress.     Breath sounds: Normal breath sounds.  Abdominal:     General: Bowel sounds are normal. There is no distension.     Palpations: Abdomen is soft.     Tenderness: There is no abdominal tenderness. There is no guarding.  Musculoskeletal:        General: Normal range of motion.     Cervical back: Normal range of motion and neck supple. Muscular tenderness present.  Skin:    General: Skin is warm and dry.     Findings: No rash.  Neurological:     Mental Status: He is alert and oriented to person, place, and time.     Cranial Nerves: No cranial nerve deficit.     Sensory: No sensory deficit.     Motor: No weakness or abnormal muscle tone.     Coordination: Coordination normal.    ED Results / Procedures / Treatments   Labs (all labs ordered are listed, but only abnormal results are displayed) Labs Reviewed  CBC  BASIC METABOLIC PANEL    EKG None  Radiology CT HEAD WO CONTRAST ( )  Result Date: 07/15/2021 CLINICAL DATA:  Chronic headaches, increasing in frequency EXAM: CT HEAD WITHOUT CONTRAST CT CERVICAL SPINE WITHOUT CONTRAST TECHNIQUE: Multidetector CT imaging of the head and cervical spine was performed following the standard protocol without intravenous contrast. Multiplanar CT image reconstructions of the cervical spine were also generated. COMPARISON:  None. FINDINGS: CT HEAD FINDINGS Brain: No evidence of acute infarction, hemorrhage, hydrocephalus, extra-axial collection or mass lesion/mass effect. Vascular: No hyperdense vessel or unexpected calcification. Skull: No osseous abnormality. Sinuses/Orbits: Tiny mucous retention cyst in the left maxillary sinus. Visualized mastoid sinuses are clear. Visualized orbits demonstrate no focal abnormality. Other: No fluid collection or hematoma. CT CERVICAL SPINE  FINDINGS Alignment: Normal. Skull base and vertebrae: No acute fracture. No primary bone lesion or focal pathologic process. Soft tissues and spinal canal: No prevertebral fluid or swelling. No visible canal hematoma. Disc levels:  Disc spaces are maintained. No foraminal narrowing. Upper chest: Lung apices are clear. Other: No fluid collection or hematoma. IMPRESSION: 1. No acute intracranial pathology. 2.  No acute osseous injury of the cervical spine. Electronically Signed   By: Elige Ko M.D.   On: 07/15/2021 14:48   CT Cervical Spine Wo Contrast  Result Date: 07/15/2021 CLINICAL DATA:  Chronic headaches, increasing in frequency EXAM: CT HEAD WITHOUT CONTRAST CT CERVICAL SPINE WITHOUT CONTRAST TECHNIQUE: Multidetector CT imaging of the head and cervical spine was performed following the standard protocol without intravenous contrast. Multiplanar CT image reconstructions of the cervical spine were also generated. COMPARISON:  None. FINDINGS: CT HEAD FINDINGS Brain: No evidence of  acute infarction, hemorrhage, hydrocephalus, extra-axial collection or mass lesion/mass effect. Vascular: No hyperdense vessel or unexpected calcification. Skull: No osseous abnormality. Sinuses/Orbits: Tiny mucous retention cyst in the left maxillary sinus. Visualized mastoid sinuses are clear. Visualized orbits demonstrate no focal abnormality. Other: No fluid collection or hematoma. CT CERVICAL SPINE FINDINGS Alignment: Normal. Skull base and vertebrae: No acute fracture. No primary bone lesion or focal pathologic process. Soft tissues and spinal canal: No prevertebral fluid or swelling. No visible canal hematoma. Disc levels:  Disc spaces are maintained. No foraminal narrowing. Upper chest: Lung apices are clear. Other: No fluid collection or hematoma. IMPRESSION: 1. No acute intracranial pathology. 2.  No acute osseous injury of the cervical spine. Electronically Signed   By: Elige Ko M.D.   On: 07/15/2021 14:48     Procedures Procedures   Medications Ordered in ED Medications  metoCLOPramide (REGLAN) injection 10 mg (10 mg Intravenous Given 07/15/21 1437)  diphenhydrAMINE (BENADRYL) injection 25 mg (25 mg Intravenous Given 07/15/21 1437)  sodium chloride 0.9 % bolus 1,000 mL (1,000 mLs Intravenous New Bag/Given 07/15/21 1437)  acetaminophen (TYLENOL) tablet 1,000 mg (1,000 mg Oral Given 07/15/21 1437)    ED Course  I have reviewed the triage vital signs and the nursing notes.  Pertinent labs & imaging results that were available during my care of the patient were reviewed by me and considered in my medical decision making (see chart for details).    MDM Rules/Calculators/A&P                           35 year old male presenting to the ED for headache and neck pain.  He has a history of cervicogenic headaches.  This current pain is different because it has lasted longer and typically improves with medication and topical interventions.  He reports intermittent blurry vision for the past 2 weeks when he has the headache.  Minimal improvement noted with Tylenol.  On exam patient without neurological deficits.  No numbness or weakness on exam.  No facial asymmetry.  Pupils are equal and reactive to light bilaterally.  No meningeal signs.  His vital signs within normal limits. Labs obtained in triage without abnormalities. Will obtain CT of head and cervical spine due to complaints of worsening headache, new features with associated visual disturbance to rule out structural cause.   CT of the head and cervical spine without any abnormalities.  Given migraine cocktail, Tylenol and IV fluids There are no headache characteristics that are lateralizing or concerning for increased ICP, infectious or vascular cause of his symptoms.   We will have him follow-up with PCP, continue home medications and return for worsening symptoms    Patient is hemodynamically stable, in NAD, and able to ambulate in the ED.  Evaluation does not show pathology that would require ongoing emergent intervention or inpatient treatment. I explained the diagnosis to the patient. Pain has been managed and has no complaints prior to discharge. Patient is comfortable with above plan and is stable for discharge at this time. All questions were answered prior to disposition. Strict return precautions for returning to the ED were discussed. Encouraged follow up with PCP.   An After Visit Summary was printed and given to the patient.   Portions of this note were generated with Scientist, clinical (histocompatibility and immunogenetics). Dictation errors may occur despite best attempts at proofreading.  Final Clinical Impression(s) / ED Diagnoses Final diagnoses:  Other migraine without status migrainosus, not intractable  Rx / DC Orders ED Discharge Orders          Ordered    Ambulatory referral to Neurology       Comments: An appointment is requested in approximately: 1 week   07/15/21 1458             Dietrich Pates, PA-C 07/15/21 1459    Horton, Clabe Seal, DO 07/16/21 1003

## 2021-07-15 NOTE — Discharge Instructions (Addendum)
Continue your home medications as previously prescribed. Follow-up with your primary care provider or the 1 listed below.   I have sent in a referral to the neurologist will contact you for an appointment. Return to the ER if you start to experience worsening pain, numbness in arms or legs, fever, chest pain or shortness of breath

## 2021-07-15 NOTE — ED Triage Notes (Signed)
Pt reports that he has has hx of migraines as well as bulging disk in his neck. Reports that he has a headache since yesterday with blurry vision in both eyes, and nausea. Neuro intact bilaterally.

## 2021-07-16 ENCOUNTER — Encounter: Payer: Self-pay | Admitting: Neurology
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
## 2021-09-02 ENCOUNTER — Ambulatory Visit: Payer: BC Managed Care – PPO | Admitting: Neurology

## 2021-09-02 ENCOUNTER — Other Ambulatory Visit: Payer: Self-pay

## 2021-09-02 ENCOUNTER — Encounter: Payer: Self-pay | Admitting: Neurology

## 2021-09-02 VITALS — BP 129/82 | HR 86 | Ht 66.0 in | Wt 165.4 lb

## 2021-09-02 DIAGNOSIS — M542 Cervicalgia: Secondary | ICD-10-CM | POA: Diagnosis not present

## 2021-09-02 DIAGNOSIS — G43809 Other migraine, not intractable, without status migrainosus: Secondary | ICD-10-CM

## 2021-09-02 MED ORDER — TOPIRAMATE 50 MG PO TABS: ORAL_TABLET | ORAL | 0 refills | Status: DC

## 2021-09-02 NOTE — Patient Instructions (Signed)
Start topiramate 50mg  tablet - take 1/2 tablet at bedtime for one week, then increase to 1 tablet at bedtime.  Contact me when you need a refill - if still with frequent headaches, we can increase dose. Take Ubrelvy 100mg  for severe headache.  May repeat in 2 hours if needed.  Maximum 2 tablets in 24 hours. Try not to take over the counter pain relievers (Tylenol, ibuprofen).  However, if you need to, limit to no more than 2 days out of week to prevent rebound headache Will refer you to Dr. of Sports Medicine for neck pain Follow up in 6 months.

## 2021-09-02 NOTE — Progress Notes (Signed)
Medication Samples have been provided to the patient.  Drug name: Bernita Raisin       Strength: 100 mg        Qty: 4   LOT: [id number]  Exp.Date: 04/2022  Dosing instructions: as needed  The patient has been instructed regarding the correct time, dose, and frequency of taking this medication, including desired effects and most common side effects.   Leida Lauth 9:57 AM 09/02/2021

## 2021-09-18 ENCOUNTER — Encounter: Payer: Self-pay | Admitting: Neurology

## 2021-09-24 ENCOUNTER — Other Ambulatory Visit: Payer: Self-pay | Admitting: Neurology

## 2021-09-24 MED ORDER — UBRELVY 100 MG PO TABS: 1.0000 | ORAL_TABLET | ORAL | 5 refills | Status: DC | PRN

## 2021-09-29 ENCOUNTER — Telehealth: Payer: Self-pay

## 2021-09-29 NOTE — Telephone Encounter (Signed)
This request has received a Favorable outcome.  Please note any additional information provided by [facility] at the bottom of your screen.  Vern Claude Key: Kendell Bane - PA Case ID: [id number] - Rx #: [id number] Need help? Call us at [phone] Outcome Approvedtoday Request Reference Number: [id number]. UBRELVY TAB 100MG  is approved through 12/28/2021. Your patient may now fill this prescription and it will be covered. Drug 12/30/2021 100MG  tablets Form [facility] Electronic Prior Authorization Form (2017 NCPDP) Original Claim Info 58

## 2021-09-29 NOTE — Telephone Encounter (Signed)
New message   Your information has been sent to [facility].  Vern Claude Key: Kendell Bane - PA Case ID: [id number] - Rx #: [id number] Need help? Call us at [phone] Status Sent to [facility] Bernita Raisin 100MG  tablets Form [facility] Electronic Prior Authorization Form (2017 NCPDP) Original Claim Info

## 2021-10-20 ENCOUNTER — Ambulatory Visit: Payer: BC Managed Care – PPO | Admitting: Neurology

## 2021-10-26 ENCOUNTER — Ambulatory Visit: Payer: BC Managed Care – PPO | Admitting: Neurology

## 2021-10-27 ENCOUNTER — Telehealth: Payer: Self-pay

## 2021-10-27 NOTE — Telephone Encounter (Signed)
New message  Jake [name] (Key: [id number]) Aimovig 140MG /ML auto-injectors   Form Blue Form (CB) Created 14 minutes ago Sent to Plan 10 minutes ago Plan Response 10 minutes ago Submit Clinical Questions 1 minute ago Determination Wait for Determination Please wait for Advertising account executive to return a determination.

## 2021-11-10 NOTE — Progress Notes (Signed)
[facility] [address] [place] [address] Phone: [phone] Subjective:   Jake [name], am serving as a scribe for Dr. Antoine Primas. This visit occurred during the SARS-CoV-2 public health emergency.  Safety protocols were in place, including screening questions prior to the visit, additional usage of staff PPE, and extensive cleaning of exam room while observing appropriate contact time as indicated for disinfecting solutions.    CC: Neck and headache  AFB:[id number]  Jake [name] is a 36 y.o. male coming in with complaint of shoulder and back pain. Cer headaches 2016 started to see Chiropractor and neurologist. Bulging disc. Pain at base of skull burning. Not being able to find any relief. Pain in traps and mid back.  Patient has been seeing neurology as well.  Did have a CT scan of the neck previouslyRight in October.  This was independently visualized by me showing no significant bony abnormality.  Patient states that when he does have a headache it does seem to start at the base of the neck and then seems to go towards his head.  Patient has noticed that when he is working and doing different positioning seems to get more difficulty.  Patient recently has gotten new glasses but has not noticed any improvement yet.       Past Medical History:  Diagnosis Date   Hx of migraines    Seizures in newborn    No past surgical history on file. Social History   Socioeconomic History   Marital status: Single    Spouse name: Not on file   Number of children: Not on file   Years of education: Not on file   Highest education level: Not on file  Occupational History   Not on file  Tobacco Use   Smoking status: Never   Smokeless tobacco: Never  Substance and Sexual Activity   Alcohol use: Yes   Drug use: No   Sexual activity: Not on file  Other Topics Concern   Not on file  Social History Narrative   Not on file   Social Determinants of  Health   Financial Resource Strain: Not on file  Food Insecurity: Not on file  Transportation Needs: Not on file  Physical Activity: Not on file  Stress: Not on file  Social Connections: Not on file   No Known Allergies Family History  Problem Relation Age of Onset   Dementia Maternal Grandfather        Current Outpatient Medications (Analgesics):    diclofenac (VOLTAREN) 75 MG EC tablet, Take 1 tablet (75 mg total) by mouth 2 (two) times daily. (Patient not taking: Reported on 11/30/2020)   HYDROcodone-acetaminophen (NORCO/VICODIN) 5-325 MG tablet, Take 1-2 tablets every 6 hours as needed for severe pain (Patient not taking: Reported on 11/30/2020)   naproxen (NAPROSYN) 500 MG tablet, Take 1 tablet (500 mg total) by mouth 2 (two) times daily. (Patient not taking: Reported on 11/30/2020)   Ubrogepant (UBRELVY) 100 MG TABS, Take 1 tablet by mouth as needed (may repeat after 2 hours.  Maximum 2 tablets in 24 hours).   Current Outpatient Medications (Other):    caffeine 200 MG TABS tablet, Take 200 mg by mouth every 4 (four) hours as needed (weight  loss). (Patient not taking: Reported on 09/02/2021)   ondansetron (ZOFRAN ODT) 4 MG disintegrating tablet, Take 1 tablet (4 mg total) by mouth every 8 (eight) hours as needed for nausea or vomiting. (Patient not taking: Reported on 11/30/2020)  topiramate (TOPAMAX) 50 MG tablet, Take 1 tablet (50 mg total) by mouth at bedtime.   Reviewed prior external information including notes and imaging from  primary care provider As well as notes that were available from care everywhere and other healthcare systems.  Past medical history, social, surgical and family history all reviewed in electronic medical record.  No pertanent information unless stated regarding to the chief complaint.   Review of Systems:  No headache, visual changes, nausea, vomiting, diarrhea, constipation, dizziness, abdominal pain, skin rash, fevers, chills, night sweats,  weight loss, swollen lymph nodes, body aches, joint swelling, chest pain, shortness of breath, mood changes. POSITIVE muscle aches  Objective  Blood pressure 132/86, pulse 90, height 5\' 6"  (1.676 m), weight 163 lb (73.9 kg), SpO2 99 %.   General: No apparent distress alert and oriented x3 mood and affect normal, dressed appropriately.  HEENT: Pupils equal, extraocular movements intact  Respiratory: Patient's speak in full sentences and does not appear short of breath  Cardiovascular: No lower extremity edema, non tender, no erythema  Gait normal with good balance and coordination.  MSK: Neck exam does show some mild loss of lordosis.  Negative Spurling's.  Patient does have mild scapular dyskinesis on the right greater than left.  Patient does have some anterior displacement of the shoulders bilaterally but does have good range of motion of the shoulders.  5 out of 5 strength of the upper extremities.  Osteopathic findings C2 flexed rotated and side bent right severe tightness noted in this area. C5 flexed rotated and side bent left T5 extended rotated and side bent right inhaled third rib T8 extended rotated and side bent left L5 flexed rotated and side bent left Sacrum right on right  [id number]; 15 additional minutes spent for Therapeutic exercises as stated in above notes.  This included exercises focusing on stretching, strengthening, with significant focus on eccentric aspects.   Long term goals include an improvement in range of motion, strength, endurance as well as avoiding reinjury. Patient's frequency would include in 1-2 times a day, 3-5 times a week for a duration of 6-12 weeks. Exercises that included:  Basic scapular stabilization to include adduction and depression of scapula Scaption, focusing on proper movement and good control Internal and External rotation utilizing a theraband, with elbow tucked at side entire time Rows with theraband   Proper technique shown and discussed  handout in great detail with ATC.  All questions were discussed and answered.      Impression and Recommendations:     The above documentation has been reviewed and is accurate and complete , DO

## 2021-11-11 ENCOUNTER — Other Ambulatory Visit: Payer: Self-pay

## 2021-11-11 ENCOUNTER — Ambulatory Visit (INDEPENDENT_AMBULATORY_CARE_PROVIDER_SITE_OTHER): Payer: BC Managed Care – PPO

## 2021-11-11 ENCOUNTER — Ambulatory Visit: Payer: BC Managed Care – PPO | Admitting: Family Medicine

## 2021-11-11 VITALS — BP 132/86 | HR 90 | Ht 66.0 in | Wt 163.0 lb

## 2021-11-11 DIAGNOSIS — M9904 Segmental and somatic dysfunction of sacral region: Secondary | ICD-10-CM

## 2021-11-11 DIAGNOSIS — M542 Cervicalgia: Secondary | ICD-10-CM

## 2021-11-11 DIAGNOSIS — G4486 Cervicogenic headache: Secondary | ICD-10-CM | POA: Diagnosis not present

## 2021-11-11 DIAGNOSIS — M9902 Segmental and somatic dysfunction of thoracic region: Secondary | ICD-10-CM

## 2021-11-11 DIAGNOSIS — M9908 Segmental and somatic dysfunction of rib cage: Secondary | ICD-10-CM

## 2021-11-11 DIAGNOSIS — M9901 Segmental and somatic dysfunction of cervical region: Secondary | ICD-10-CM

## 2021-11-11 DIAGNOSIS — M9903 Segmental and somatic dysfunction of lumbar region: Secondary | ICD-10-CM | POA: Diagnosis not present

## 2021-11-11 DIAGNOSIS — M2550 Pain in unspecified joint: Secondary | ICD-10-CM | POA: Diagnosis not present

## 2021-11-11 NOTE — Patient Instructions (Addendum)
Do prescribed exercises at least 3x a week Vit 2000iu CoQ 10 200mg  Yoga wheel Xrays today See you again in 5-6 weeks

## 2021-11-11 NOTE — Assessment & Plan Note (Signed)

## 2021-11-11 NOTE — Assessment & Plan Note (Signed)
Patient signs and symptoms do can seem to be more secondary to cervicogenic headaches.Patient has had work-up including CT scan of the head and the neck that was unremarkable.  Discussed with patient about the possibility of laboratory work-up to see if anything else could be contributing to some of the different headaches.  We will get those done today as well.  Patient work with Event organiser to learn home exercises in greater detail.  Discussed icing regimen and home exercises.  We discussed over-the-counter medications could be beneficial but we did not prescribe anything.  Follow-up again in 4 to 6 weeks

## 2021-11-12 ENCOUNTER — Ambulatory Visit: Payer: BC Managed Care – PPO | Admitting: Family Medicine

## 2021-11-24 ENCOUNTER — Other Ambulatory Visit: Payer: Self-pay

## 2021-11-24 MED ORDER — AIMOVIG 140 MG/ML ~~LOC~~ SOAJ: 140.0000 mg | SUBCUTANEOUS | 5 refills | Status: DC

## 2021-11-24 NOTE — Telephone Encounter (Signed)
F/u  This request has received a Cancelled outcome.  This may mean either your patient does not have active coverage with this plan, this authorization was processed as a duplicate request, or an authorization was not needed for this medication.  Note any additional information provided by [facility] at the bottom of this request, and contact [employer] [place] directly for further details.

## 2021-11-26 NOTE — Telephone Encounter (Signed)
F/u  Jake [name] (Key: [id number])  This request has received a Cancelled outcome.  This may mean either your patient does not have active coverage with this plan, this authorization was processed as a duplicate request, or an authorization was not needed for this medication.  Note any additional information provided by [facility] at the bottom of this request, and contact [employer] [place] directly for further details.  Jake [name] (Key: [id number]) Aimovig 140MG /ML auto-injectors   Form Blue Form (CB) Created 1 month ago Sent to Plan 1 month ago Plan Response 1 month ago Submit Clinical Questions 1 month ago Determination 1 month ago Message from Advertising account executive NM

## 2021-11-30 ENCOUNTER — Telehealth: Payer: Self-pay

## 2021-11-30 NOTE — Telephone Encounter (Signed)
New message    [facility] is processing your PA request and will respond shortly with next steps. You may close this dialog, return to your dashboard, and perform other tasks. To check for an update later, open this request again from your dashboard.  If you need assistance, please chat with CoverMyMeds or call us at [phone].  Vern Claude (KeyValetta Fuller) Rx #: [id number] Aimovig 140MG /ML auto-injectors   Form [facility] Electronic Prior Authorization Form (2017 NCPDP) Created 3 days ago Sent to Plan 1 minute ago Determination Wait for Questions [facility] 2017 NCPDP typically responds with questions in less than 15 minutes, but may take up to 24 hours.

## 2021-11-30 NOTE — Telephone Encounter (Signed)
Your information has been sent to [facility].  Jake [name] (KeyValetta Fuller) Rx #: [id number] Aimovig 140MG /ML auto-injectors   Form [facility] Electronic Prior Authorization Form (2017 NCPDP) Created 3 days ago Sent to Plan 6 minutes ago Plan Response 5 minutes ago Submit Clinical Questions less than a minute ago Determination Wait for Determination Please wait for [facility] 2017 NCPDP to return a determination.

## 2021-11-30 NOTE — Telephone Encounter (Signed)
New message   Your information has been sent to [facility].   Vern Claude Key: [id number] - PA Case ID: [id number] Need help? Call us at [phone] Status Sent to [facility] Drug Bernita Raisin 100MG  tablets Form [facility] Electronic Prior Authorization Form [phone] NCPDP)

## 2021-11-30 NOTE — Telephone Encounter (Signed)
F/u  Velora Mediate Key: [id number] - PA Case ID: [id number] Need help? Call us at [phone] Outcome Approvedtoday Request Reference Number: [id number]. UBRELVY TAB 100MG  is approved through 11/30/2022. Your patient may now fill this prescription and it will be covered. Drug Roselyn Meier 100MG  tablets Form [facility] Electronic Prior Authorization Form [phone] NCPDP)

## 2021-12-01 NOTE — Telephone Encounter (Signed)
F/u  Velora Mediate (Key: [id number]) Rx #: [id number] Aimovig 140MG /ML auto-injectors   Form [facility] Electronic Prior Authorization Form (2017 NCPDP) Created 4 days ago Sent to Plan 22 hours ago Plan Response 22 hours ago Submit Clinical Questions 22 hours ago Determination Favorable 21 hours ago Message from Plan Request Reference Number: [id number]. AIMOVIG INJ 140MG /ML is approved through 05/30/2022. Your patient may now fill this prescription and it will be covered.

## 2021-12-15 NOTE — Progress Notes (Signed)
?  Terrilee Files D.O. ?[facility] ?[address] [place] [address] ?Phone: [phone] ?Subjective:   ?I, Nadine Counts, am serving as a Neurosurgeon for Dr. Antoine Primas. ?This visit occurred during the SARS-CoV-2 public health emergency.  Safety protocols were in place, including screening questions prior to the visit, additional usage of staff PPE, and extensive cleaning of exam room while observing appropriate contact time as indicated for disinfecting solutions.  ? ?I'm seeing this patient by the request  of:  Patient, No Pcp Per (Inactive) ? ?CC: Neck and back pain follow-up ? ?[id number]  ?Jake [name] is a 36 y.o. male coming in with complaint of back and neck pain. OMT on 11/11/2021. Patient states manipulation did help. Job change so that helps. Hasn't been doing exercises consistently. Wants to go over labs with him. Has been taking CoQ 10 and vit D. Helps with sleep. No other complaints. ? ?Medications patient has been prescribed: None ? ?Taking: ? ? ?  ? ? ? ? ? ?Past Medical History:  ?Diagnosis Date  ? Hx of migraines   ? Seizures in newborn   ?  ?No Known Allergies ? ? ?Review of Systems: ? No headache, visual changes, nausea, vomiting, diarrhea, constipation, dizziness, abdominal pain, skin rash, fevers, chills, night sweats, weight loss, swollen lymph nodes, body aches, joint swelling, chest pain, shortness of breath, mood changes. POSITIVE muscle aches ? ?Objective  ?Blood pressure 120/90, pulse 72, height 5\' 6"  (1.676 m), weight 155 lb (70.3 kg), SpO2 99 %. ?  ?General: No apparent distress alert and oriented x3 mood and affect normal, dressed appropriately.  ?HEENT: Pupils equal, extraocular movements intact  ?Respiratory: Patient's speak in full sentences and does not appear short of breath  ?Cardiovascular: No lower extremity edema, non tender, no erythema  ?Neck exam does have some mild loss of lordosis.  Patient does have tightness noted with sidebending  bilaterally. ? ?Osteopathic findings ? ?C2 flexed rotated and side bent right ?C6 flexed rotated and side bent left ?T3 extended rotated and side bent right inhaled rib ?T9 extended rotated and side bent left ?L2 flexed rotated and side bent right ?Sacrum right on right ? ? ? ? ?  ?Assessment and Plan: ? ?Cervicogenic headache ?Patient is already making some progress with taking the vitamin supplementations.  I discussed with patient about icing regimen and home exercise, we discussed that the patient's posture seems to be improving somewhat.  Increase activity slowly.  Follow-up with me again in 6 to 8 weeks  ? ?Nonallopathic problems ? ?Decision today to treat with OMT was based on Physical Exam ? ?After verbal consent patient was treated with HVLA, ME, FPR techniques in cervical, rib, thoracic, lumbar, and sacral  areas ? ?Patient tolerated the procedure well with improvement in symptoms ? ?Patient given exercises, stretches and lifestyle modifications ? ?See medications in patient instructions if given ? ?Patient will follow up in 4-8 weeks ? ?  ? ? ?The above documentation has been reviewed and is accurate and complete , DO ? ? ?  ? ? Note: This dictation was prepared with Dragon dictation along with smaller phrase technology. Any transcriptional errors that result from this process are unintentional.    ?  ?  ? ?

## 2021-12-16 ENCOUNTER — Ambulatory Visit: Payer: BC Managed Care – PPO | Admitting: Family Medicine

## 2021-12-16 ENCOUNTER — Other Ambulatory Visit: Payer: Self-pay

## 2021-12-16 VITALS — BP 120/90 | HR 72 | Ht 66.0 in | Wt 155.0 lb

## 2021-12-16 DIAGNOSIS — M9902 Segmental and somatic dysfunction of thoracic region: Secondary | ICD-10-CM

## 2021-12-16 DIAGNOSIS — M9901 Segmental and somatic dysfunction of cervical region: Secondary | ICD-10-CM

## 2021-12-16 DIAGNOSIS — M9903 Segmental and somatic dysfunction of lumbar region: Secondary | ICD-10-CM | POA: Diagnosis not present

## 2021-12-16 DIAGNOSIS — M9904 Segmental and somatic dysfunction of sacral region: Secondary | ICD-10-CM

## 2021-12-16 DIAGNOSIS — G4486 Cervicogenic headache: Secondary | ICD-10-CM

## 2021-12-16 DIAGNOSIS — M9908 Segmental and somatic dysfunction of rib cage: Secondary | ICD-10-CM | POA: Diagnosis not present

## 2021-12-16 NOTE — Assessment & Plan Note (Signed)
Patient is already making some progress with taking the vitamin supplementations.  I discussed with patient about icing regimen and home exercise, we discussed that the patient's posture seems to be improving somewhat.  Increase activity slowly.  Follow-up with me again in 6 to 8 weeks ?

## 2021-12-16 NOTE — Patient Instructions (Signed)
Good to see you! ?Think you're making progress, do those exercises more consistently ?New job ergonomics are helping ?See you again in 2 months ?

## 2022-01-31 ENCOUNTER — Other Ambulatory Visit: Payer: Self-pay | Admitting: Neurology

## 2022-02-06 ENCOUNTER — Encounter: Payer: Self-pay | Admitting: Neurology

## 2022-02-08 ENCOUNTER — Other Ambulatory Visit: Payer: Self-pay | Admitting: Neurology

## 2022-02-08 MED ORDER — TOPIRAMATE 50 MG PO TABS: 100.0000 mg | ORAL_TABLET | Freq: Every day | ORAL | 3 refills | Status: DC

## 2022-02-16 NOTE — Progress Notes (Signed)
?Terrilee Files D.O. ? [facility] ?[address] [place] [address] ?Phone: [phone] ?Subjective:   ?I, Wilford Grist, am serving as a scribe for Dr. Antoine Primas. ? ?This visit occurred during the SARS-CoV-2 public health emergency.  Safety protocols were in place, including screening questions prior to the visit, additional usage of staff PPE, and extensive cleaning of exam room while observing appropriate contact time as indicated for disinfecting solutions.  ? ?I'm seeing this patient by the request  of:  Patient, No Pcp Per (Inactive) ? ?CC: Back and neck pain follow-up ? ?[id number]  ?Jake [name] is a 36 y.o. male coming in with complaint of back and neck pain. OMT on 12/16/2021. Patient states that on Saturday his pain increased. States that he had kidney stone: dark urine, vomiting. Believed he passed stone but still has pain in R lumbar spine. Did also have a pop in lumbar spine when he was extending back over the weekend.  ? ?Purchased yoga wheel and has used it twice and feels that it is helpful.  ? ?Medications patient has been prescribed: None ? ?Taking: ? ? ?  ? ? ? ? ?Reviewed prior external information including notes and imaging from previsou exam, outside providers and external EMR if available.  ? ?As well as notes that were available from care everywhere and other healthcare systems. ? ?Past medical history, social, surgical and family history all reviewed in electronic medical record.  No pertanent information unless stated regarding to the chief complaint.  ? ?Past Medical History:  ?Diagnosis Date  ? Hx of migraines   ? Seizures in newborn   ?  ?No Known Allergies ? ? ?Review of Systems: ? No headache, visual changes, nausea, vomiting, diarrhea, constipation, dizziness, abdominal pain, skin rash, fevers, chills, night sweats, weight loss, swollen lymph nodes, body aches, joint swelling, chest pain, shortness of breath, mood changes. POSITIVE muscle aches ? ?Objective   ?Blood pressure 114/84, pulse (!) 107, height 5\' 6"  (1.676 m), weight 148 lb (67.1 kg), SpO2 98 %. ?  ?General: No apparent distress alert and oriented x3 mood and affect normal, dressed appropriately.  ?HEENT: Pupils equal, extraocular movements intact  ?Respiratory: Patient's speak in full sentences and does not appear short of breath  ?Cardiovascular: No lower extremity edema, non tender, no erythema  ?Low back exam does have some more tightness than usual.  Some tenderness to palpation in the paraspinal musculature. ? ?Osteopathic findings ? ?C3 flexed rotated and side bent right ?C6 flexed rotated and side bent left ?T8 extended rotated and side bent left ?L2 flexed rotated and side bent right ?Sacrum right on right ? ? ? ? ?  ?Assessment and Plan: ? ?Cervicogenic headache ?Cervicogenic headaches noted.  Has some mild increase in back pain.  Could be potentially secondary to recent kidney stone events.  We will get urinalysis to see if anything else is contributing.  Patient will follow-up with neurology to discuss other things as needed.  Discussed home exercises and icing regimen.  Follow-up with me again in 6 to 8 weeks. ?  ? ?Nonallopathic problems ? ?Decision today to treat with OMT was based on Physical Exam ? ?After verbal consent patient was treated with HVLA, ME, FPR techniques in cervical, rib, thoracic, lumbar, and sacral  areas ? ?Patient tolerated the procedure well with improvement in symptoms ? ?Patient given exercises, stretches and lifestyle modifications ? ?See medications in patient instructions if given ? ?Patient will follow up in 4-8 weeks ? ?  ? ? ?  The above documentation has been reviewed and is accurate and complete Jake Pulley, DO ? ? ? ?  ? ? Note: This dictation was prepared with Dragon dictation along with smaller phrase technology. Any transcriptional errors that result from this process are unintentional.    ?  ?  ? ?

## 2022-02-17 ENCOUNTER — Ambulatory Visit (INDEPENDENT_AMBULATORY_CARE_PROVIDER_SITE_OTHER): Payer: BC Managed Care – PPO

## 2022-02-17 ENCOUNTER — Encounter: Payer: Self-pay | Admitting: Family Medicine

## 2022-02-17 ENCOUNTER — Ambulatory Visit: Payer: BC Managed Care – PPO | Admitting: Family Medicine

## 2022-02-17 VITALS — BP 114/84 | HR 107 | Ht 66.0 in | Wt 148.0 lb

## 2022-02-17 DIAGNOSIS — M9903 Segmental and somatic dysfunction of lumbar region: Secondary | ICD-10-CM

## 2022-02-17 DIAGNOSIS — M2550 Pain in unspecified joint: Secondary | ICD-10-CM

## 2022-02-17 DIAGNOSIS — Z87442 Personal history of urinary calculi: Secondary | ICD-10-CM

## 2022-02-17 DIAGNOSIS — G4486 Cervicogenic headache: Secondary | ICD-10-CM | POA: Diagnosis not present

## 2022-02-17 DIAGNOSIS — M9902 Segmental and somatic dysfunction of thoracic region: Secondary | ICD-10-CM

## 2022-02-17 DIAGNOSIS — M9901 Segmental and somatic dysfunction of cervical region: Secondary | ICD-10-CM | POA: Diagnosis not present

## 2022-02-17 DIAGNOSIS — M9904 Segmental and somatic dysfunction of sacral region: Secondary | ICD-10-CM

## 2022-02-17 LAB — URINALYSIS
Bilirubin Urine: NEGATIVE
Hgb urine dipstick: NEGATIVE
Ketones, ur: NEGATIVE
Leukocytes,Ua: NEGATIVE
Nitrite: NEGATIVE
Specific Gravity, Urine: 1.015 (ref 1.000–1.030)
Total Protein, Urine: NEGATIVE
Urine Glucose: NEGATIVE
Urobilinogen, UA: 1 (ref 0.0–1.0)
pH: 8.5 — AB (ref 5.0–8.0)

## 2022-02-17 NOTE — Assessment & Plan Note (Addendum)
Cervicogenic headaches noted.  Has some mild increase in back pain.  Could be potentially secondary to recent kidney stone events.  We will get urinalysis to see if anything else is contributing.  Patient will follow-up with neurology to discuss other things as needed.  Discussed home exercises and icing regimen.  Follow-up with me again in 6 to 8 weeks. ? ?

## 2022-02-17 NOTE — Patient Instructions (Addendum)
Xray today ?Urine check today as well  ?Try to get in gym and work on upper back some  ?See me again in 7-8 weeks ?

## 2022-02-18 ENCOUNTER — Other Ambulatory Visit: Payer: Self-pay | Admitting: Neurology

## 2022-02-22 ENCOUNTER — Other Ambulatory Visit: Payer: Self-pay

## 2022-02-22 MED ORDER — TOPIRAMATE 50 MG PO TABS: 100.0000 mg | ORAL_TABLET | Freq: Every day | ORAL | 3 refills | Status: DC

## 2022-02-22 NOTE — Telephone Encounter (Signed)
Topiramate 50 mg Take 2 tabs at bedtime resent to [facility] per pt request.  ?
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
## 2022-03-15 ENCOUNTER — Ambulatory Visit: Payer: BC Managed Care – PPO | Admitting: Neurology

## 2022-03-15 ENCOUNTER — Encounter: Payer: Self-pay | Admitting: Neurology

## 2022-03-15 VITALS — BP 125/85 | HR 83 | Ht 66.0 in | Wt 142.0 lb

## 2022-03-15 DIAGNOSIS — G43009 Migraine without aura, not intractable, without status migrainosus: Secondary | ICD-10-CM

## 2022-03-15 MED ORDER — CYCLOBENZAPRINE HCL 10 MG PO TABS: 10.0000 mg | ORAL_TABLET | Freq: Every day | ORAL | 5 refills | Status: DC

## 2022-03-15 NOTE — Patient Instructions (Signed)
Aimovig 140mg  every 28 days Topiramate 100mg  at bedtime Start cyclobenzaprine 10mg  at bedtime Ubrelvy as needed/directed Follow up with Dr. Tamala Julian Follow up 4 months.

## 2022-04-07 NOTE — Progress Notes (Unsigned)
  [name] [address] [place] [address] Phone: [phone] Subjective:   Jake [name], am serving as a scribe for Dr. Antoine Primas.  I'm seeing this patient by the request  of:  Patient, No Pcp Per  CC: Neck pain and headache follow-up  [id number]  Jake [name] is a 36 y.o. male coming in with complaint of back and neck pain. OMT on 02/17/2022. Patient states that he is doing better. Saw Dr. Everlena Cooper and was taken off of medication. Less pressure in neck since starting to take muscle relaxer.   Medications patient has been prescribed: None  Taking:         Reviewed prior external information including notes and imaging from previsou exam, outside providers and external EMR if available.   As well as notes that were available from care everywhere and other healthcare systems.  Past medical history, social, surgical and family history all reviewed in electronic medical record.  No pertanent information unless stated regarding to the chief complaint.   Past Medical History:  Diagnosis Date   Hx of migraines    Seizures in newborn     No Known Allergies   Review of Systems:  No  visual changes, nausea, vomiting, diarrhea, constipation, dizziness, abdominal pain, skin rash, fevers, chills, night sweats, weight loss, swollen lymph nodes, body aches, joint swelling, chest pain, shortness of breath, mood changes. POSITIVE muscle aches, headache  Objective  Blood pressure 124/74, pulse 94, height 5\' 6"  (1.676 m), weight 143 lb (64.9 kg), SpO2 98 %.   General: No apparent distress alert and oriented x3 mood and affect normal, dressed appropriately.  HEENT: Pupils equal, extraocular movements intact  Respiratory: Patient's speak in full sentences and does not appear short of breath  Cardiovascular: No lower extremity edema, non tender, no erythema  Gait MSK:  Back has improved in strength overall in the scapular region.  Patient  does have improvement in his endurance and appears already with some of the different activities.  Patient still has tightness noted in the occipital region left greater than right.  Osteopathic findings  C2 flexed rotated and side bent left C6 flexed rotated and side bent left T6 extended rotated and side bent left inhaled rib L2 flexed rotated and side bent right Sacrum right on right    Assessment and Plan:  Cervicogenic headache Cervicogenic.  Extremely relation.  Patient is doing the medications as.  Discussed cyclobenzaprine.  Patient will continue.  Pain patient has any difficulty okay if patient did put some ice.  Patient will follow-up with me again in 2 to 3 months.    Nonallopathic problems  Decision today to treat with OMT was based on Physical Exam  After verbal consent patient was treated with HVLA, ME, FPR techniques in cervical, rib, thoracic, lumbar, and sacral  areas  Patient tolerated the procedure well with improvement in symptoms  Patient given exercises, stretches and lifestyle modifications  See medications in patient instructions if given  Patient will follow up in 8-12 weeks     The above documentation has been reviewed and is accurate and complete 10-12, DO         Note: This dictation was prepared with Dragon dictation along with smaller phrase technology. Any transcriptional errors that result from this process are unintentional.

## 2022-04-08 ENCOUNTER — Ambulatory Visit (INDEPENDENT_AMBULATORY_CARE_PROVIDER_SITE_OTHER): Payer: BC Managed Care – PPO | Admitting: Family Medicine

## 2022-04-08 VITALS — BP 124/74 | HR 94 | Ht 66.0 in | Wt 143.0 lb

## 2022-04-08 DIAGNOSIS — M9903 Segmental and somatic dysfunction of lumbar region: Secondary | ICD-10-CM | POA: Diagnosis not present

## 2022-04-08 DIAGNOSIS — M9902 Segmental and somatic dysfunction of thoracic region: Secondary | ICD-10-CM | POA: Diagnosis not present

## 2022-04-08 DIAGNOSIS — M9904 Segmental and somatic dysfunction of sacral region: Secondary | ICD-10-CM | POA: Diagnosis not present

## 2022-04-08 DIAGNOSIS — M9901 Segmental and somatic dysfunction of cervical region: Secondary | ICD-10-CM

## 2022-04-08 DIAGNOSIS — G4486 Cervicogenic headache: Secondary | ICD-10-CM | POA: Diagnosis not present

## 2022-04-08 DIAGNOSIS — M9908 Segmental and somatic dysfunction of rib cage: Secondary | ICD-10-CM

## 2022-04-08 NOTE — Assessment & Plan Note (Signed)
Cervicogenic.  Extremely relation.  Patient is doing the medications as.  Discussed cyclobenzaprine.  Patient will continue.  Pain patient has any difficulty okay if patient did put some ice.  Patient will follow-up with me again in 2 to 3 months.

## 2022-04-08 NOTE — Patient Instructions (Signed)
Great to see you Be proud of yourself Shorten stride length Interval training  See me in 3 months

## 2022-05-03 ENCOUNTER — Telehealth (HOSPITAL_COMMUNITY): Payer: Self-pay | Admitting: Pharmacy Technician

## 2022-05-03 NOTE — Telephone Encounter (Signed)
Patient Advocate Encounter  Prior Authorization for Aimovig 140MG /ML auto-injectors has been approved.    PA# Effective dates: 05/03/2022 through 05/04/2023      05/06/2023, CPhT Pharmacy Patient Advocate Specialist [facility] Patient Advocate Team Direct Number: [phone]  Fax: [phone]

## 2022-05-03 NOTE — Telephone Encounter (Signed)
Patient Advocate Encounter   Received notification that prior authorization for Aimovig 140MG /ML auto-injectors is required.   PA submitted on 05/03/2022 Key 05/05/2022 Status is pending       [id number], CPhT Pharmacy Patient Advocate Specialist [facility] Patient Advocate Team Direct Number: [phone]  Fax: [phone]

## 2022-06-03 ENCOUNTER — Other Ambulatory Visit: Payer: Self-pay | Admitting: Neurology

## 2022-06-03 NOTE — Telephone Encounter (Signed)
No further refills until seen in October by Alma Downs

## 2022-06-07 ENCOUNTER — Other Ambulatory Visit: Payer: Self-pay

## 2022-06-07 MED ORDER — AIMOVIG 140 MG/ML ~~LOC~~ SOAJ: SUBCUTANEOUS | 2 refills | Status: DC

## 2022-07-05 NOTE — Progress Notes (Deleted)
  [place] Phone: [phone] Subjective:    I'm seeing this patient by the request  of:  Patient, No Pcp Per  CC:   [id number]  Karas Pickerill is a 36 y.o. male coming in with complaint of back and neck pain. OMT 04/08/2022. Patient states   Medications patient has been prescribed: None  Taking:         Reviewed prior external information including notes and imaging from previsou exam, outside providers and external EMR if available.   As well as notes that were available from care everywhere and other healthcare systems.  Past medical history, social, surgical and family history all reviewed in electronic medical record.  No pertanent information unless stated regarding to the chief complaint.   Past Medical History:  Diagnosis Date   Hx of migraines    Seizures in newborn     No Known Allergies   Review of Systems:  No headache, visual changes, nausea, vomiting, diarrhea, constipation, dizziness, abdominal pain, skin rash, fevers, chills, night sweats, weight loss, swollen lymph nodes, body aches, joint swelling, chest pain, shortness of breath, mood changes. POSITIVE muscle aches  Objective  There were no vitals taken for this visit.   General: No apparent distress alert and oriented x3 mood and affect normal, dressed appropriately.  HEENT: Pupils equal, extraocular movements intact  Respiratory: Patient's speak in full sentences and does not appear short of breath  Cardiovascular: No lower extremity edema, non tender, no erythema  Gait MSK:  Back   Osteopathic findings  C2 flexed rotated and side bent right C6 flexed rotated and side bent left T3 extended rotated and side bent right inhaled rib T9 extended rotated and side bent left L2 flexed rotated and side bent right Sacrum right on right       Assessment and Plan:  No problem-specific Assessment & Plan notes found for  this encounter.    Nonallopathic problems  Decision today to treat with OMT was based on Physical Exam  After verbal consent patient was treated with HVLA, ME, FPR techniques in cervical, rib, thoracic, lumbar, and sacral  areas  Patient tolerated the procedure well with improvement in symptoms  Patient given exercises, stretches and lifestyle modifications  See medications in patient instructions if given  Patient will follow up in 4-8 weeks             Note: This dictation was prepared with Dragon dictation along with smaller phrase technology. Any transcriptional errors that result from this process are unintentional.

## 2022-07-06 ENCOUNTER — Ambulatory Visit: Payer: BC Managed Care – PPO | Admitting: Family Medicine
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
## 2022-07-21 ENCOUNTER — Ambulatory Visit (INDEPENDENT_AMBULATORY_CARE_PROVIDER_SITE_OTHER): Payer: BC Managed Care – PPO | Admitting: Neurology

## 2022-07-21 ENCOUNTER — Encounter: Payer: Self-pay | Admitting: Neurology

## 2022-07-21 VITALS — BP 124/69 | HR 84 | Ht 66.0 in | Wt 155.0 lb

## 2022-07-21 DIAGNOSIS — G43009 Migraine without aura, not intractable, without status migrainosus: Secondary | ICD-10-CM | POA: Diagnosis not present

## 2022-07-21 DIAGNOSIS — M542 Cervicalgia: Secondary | ICD-10-CM | POA: Diagnosis not present

## 2022-07-21 MED ORDER — AIMOVIG 140 MG/ML ~~LOC~~ SOAJ: SUBCUTANEOUS | 11 refills | Status: DC

## 2022-07-21 MED ORDER — CYCLOBENZAPRINE HCL 10 MG PO TABS: 10.0000 mg | ORAL_TABLET | Freq: Every day | ORAL | 5 refills | Status: DC

## 2022-07-21 MED ORDER — UBRELVY 100 MG PO TABS: 1.0000 | ORAL_TABLET | ORAL | 5 refills | Status: DC | PRN

## 2022-07-21 NOTE — Patient Instructions (Signed)
If Roselyn Meier does not work, use Trudhesa nasal spray.  Prime 4 times then  1 spray in each nostril.  May repeat (with new device) after 1 hour if needed (maximum 2 doses in 24 hours).  If effective, contact me for prescription.   Follow up 6 months.

## 2022-08-02 NOTE — Progress Notes (Signed)
  [place] Phone: [phone] Subjective:   Fontaine No, am serving as a scribe for Dr. Hulan Saas.  I'm seeing this patient by the request  of:  Patient, No Pcp Per  CC: Cervicogenic headaches follow-up  [id number]  Mohit Zirbes is a 36 y.o. male coming in with complaint of back and neck pain. OMT 04/08/2022. Patient states that he has been doing well but has intermittent pain in R side of neck, near the base of the skull. Has had 2 migraines this month vs 7-8 per month. Using new medication for migraines which is working.   Medications patient has been prescribed: None  Taking:         Reviewed prior external information including notes and imaging from previsou exam, outside providers and external EMR if available.   As well as notes that were available from care everywhere and other healthcare systems.  Past medical history, social, surgical and family history all reviewed in electronic medical record.  No pertanent information unless stated regarding to the chief complaint.   Past Medical History:  Diagnosis Date   Hx of migraines    Seizures in newborn     No Known Allergies   Review of Systems:  No , visual changes, nausea, vomiting, diarrhea, constipation, dizziness, abdominal pain, skin rash, fevers, chills, night sweats, weight loss, swollen lymph nodes, body aches, joint swelling, chest pain, shortness of breath, mood changes. POSITIVE muscle aches, headache  Objective  Blood pressure 108/80, pulse 81, height 5\' 6"  (1.676 m), weight 154 lb (69.9 kg), SpO2 98 %.   General: No apparent distress alert and oriented x3 mood and affect normal, dressed appropriately.  HEENT: Pupils equal, extraocular movements intact  Respiratory: Patient's speak in full sentences and does not appear short of breath  Cardiovascular: No lower extremity edema, non tender, no erythema  Neck exam does  have tightness noted in the occipital area on the left side.  Tenderness in the left parascapular region as well noted.  Negative Spurling's.  5-5 strength in the upper extremities.  Osteopathic findings  C2 flexed rotated and side bent left T9 extended rotated and side bent left with inhaled rib L2 flexed rotated and side bent right Sacrum right on right       Assessment and Plan:  Cervicogenic headache Continues to have some cervicogenic headaches.  Seems to be tighter in the neck on the left side.  We discussed icing regimen and home exercises.  Which activities to do and which ones to avoid.  Increase activity slowly over the course the next several weeks.  We will follow-up again in 6 to 8 weeks    Nonallopathic problems  Decision today to treat with OMT was based on Physical Exam  After verbal consent patient was treated with HVLA, ME, FPR techniques in cervical, rib, thoracic, lumbar, and sacral  areas  Patient tolerated the procedure well with improvement in symptoms  Patient given exercises, stretches and lifestyle modifications  See medications in patient instructions if given  Patient will follow up in 4-8 weeks     The above documentation has been reviewed and is accurate and complete Lyndal Pulley, DO         Note: This dictation was prepared with Dragon dictation along with smaller phrase technology. Any transcriptional errors that result from this process are unintentional.

## 2022-08-03 ENCOUNTER — Ambulatory Visit: Payer: BC Managed Care – PPO | Admitting: Family Medicine

## 2022-08-03 ENCOUNTER — Encounter: Payer: Self-pay | Admitting: Family Medicine

## 2022-08-03 VITALS — BP 108/80 | HR 81 | Ht 66.0 in | Wt 154.0 lb

## 2022-08-03 DIAGNOSIS — M9902 Segmental and somatic dysfunction of thoracic region: Secondary | ICD-10-CM

## 2022-08-03 DIAGNOSIS — M9903 Segmental and somatic dysfunction of lumbar region: Secondary | ICD-10-CM | POA: Diagnosis not present

## 2022-08-03 DIAGNOSIS — M9901 Segmental and somatic dysfunction of cervical region: Secondary | ICD-10-CM

## 2022-08-03 DIAGNOSIS — M9908 Segmental and somatic dysfunction of rib cage: Secondary | ICD-10-CM | POA: Diagnosis not present

## 2022-08-03 DIAGNOSIS — M9904 Segmental and somatic dysfunction of sacral region: Secondary | ICD-10-CM | POA: Diagnosis not present

## 2022-08-03 DIAGNOSIS — G4486 Cervicogenic headache: Secondary | ICD-10-CM | POA: Diagnosis not present

## 2022-08-03 NOTE — Assessment & Plan Note (Signed)
Continues to have some cervicogenic headaches.  Seems to be tighter in the neck on the left side.  We discussed icing regimen and home exercises.  Which activities to do and which ones to avoid.  Increase activity slowly over the course the next several weeks.  We will follow-up again in 6 to 8 weeks

## 2022-08-03 NOTE — Patient Instructions (Signed)
Scapular exercises No carbs within 3 hours of bedtime See me again in 10-12 weeks

## 2022-08-09 ENCOUNTER — Encounter: Payer: Self-pay | Admitting: Neurology

## 2022-08-24 ENCOUNTER — Other Ambulatory Visit: Payer: Self-pay | Admitting: Neurology

## 2022-08-24 MED ORDER — TRUDHESA 0.725 MG/ACT NA AERS: INHALATION_SPRAY | NASAL | 11 refills | Status: AC

## 2022-08-24 MED ORDER — TRUDHESA 0.725 MG/ACT NA AERS: INHALATION_SPRAY | NASAL | 11 refills | Status: DC

## 2022-09-01 ENCOUNTER — Other Ambulatory Visit: Payer: Self-pay | Admitting: Neurology

## 2022-09-13 ENCOUNTER — Encounter: Payer: Self-pay | Admitting: Neurology

## 2022-09-14 ENCOUNTER — Other Ambulatory Visit (HOSPITAL_COMMUNITY): Payer: Self-pay

## 2022-10-06 NOTE — Progress Notes (Signed)
  [facility] [address] [place] [address] Phone: [phone] Subjective:   Jake [name], am serving as a scribe for Dr. Antoine Primas.  I'm seeing this patient by the request  of:  Patient, No Pcp Per  CC: Neck pain and headache follow-up  [id number]  Jake [name] is a 36 y.o. male coming in with complaint of back and neck pain. OMT 08/03/2022. Patient states that he has knot in R side of cervical spine near base of skull. Also having tightness in L trap. Is trying to work more ergonomically while at work. Neck pain will cause headaches.  Seems that the manipulation can help.  Has about 5 to 6 weeks of improvement  Medications patient has been prescribed: None  Taking:         Reviewed prior external information including notes and imaging from previsou exam, outside providers and external EMR if available.   As well as notes that were available from care everywhere and other healthcare systems.  Past medical history, social, surgical and family history all reviewed in electronic medical record.  No pertanent information unless stated regarding to the chief complaint.   Past Medical History:  Diagnosis Date   Hx of migraines    Seizures in newborn     No Known Allergies   Review of Systems:  No , visual changes, nausea, vomiting, diarrhea, constipation, dizziness, abdominal pain, skin rash, fevers, chills, night sweats, weight loss, swollen lymph nodes, body aches, joint swelling, chest pain, shortness of breath, mood changes. POSITIVE muscle aches, headache   Objective  Blood pressure 104/76, pulse 95, height 5\' 6"  (1.676 m), weight 157 lb (71.2 kg), SpO2 98 %.   General: No apparent distress alert and oriented x3 mood and affect normal, dressed appropriately.  HEENT: Pupils equal, extraocular movements intact  Respiratory: Patient's speak in full sentences and does not appear short of breath  Cardiovascular: No lower  extremity edema, non tender, no erythema  Neck exam does have some loss of lordosis.  Osteopathic findings  C2 flexed rotated and side bent right C6 flexed rotated and side bent left T3 extended rotated and side bent right inhaled rib T8 extended rotated and side bent left L1 flexed rotated and side bent right Sacrum right on right       Assessment and Plan:  Cervicogenic headache Continues to have some mild cervicogenic headaches but has noticed that worsening diet changes has done relatively well overall.  Discussed with patient about icing regimen and home exercises, discussed with patient about continuing with posture and ergonomics.  Increase activity slowly.  Will follow-up again next 6 to 12 weeks    Nonallopathic problems  Decision today to treat with OMT was based on Physical Exam  After verbal consent patient was treated with HVLA, ME, FPR techniques in cervical, rib, thoracic, lumbar, and sacral  areas  Patient tolerated the procedure well with improvement in symptoms  Patient given exercises, stretches and lifestyle modifications  See medications in patient instructions if given  Patient will follow up in 4-8 weeks     The above documentation has been reviewed and is accurate and complete , DO         Note: This dictation was prepared with Dragon dictation along with smaller phrase technology. Any transcriptional errors that result from this process are unintentional.

## 2022-10-12 ENCOUNTER — Ambulatory Visit (INDEPENDENT_AMBULATORY_CARE_PROVIDER_SITE_OTHER): Payer: BC Managed Care – PPO | Admitting: Family Medicine

## 2022-10-12 VITALS — BP 104/76 | HR 95 | Ht 66.0 in | Wt 157.0 lb

## 2022-10-12 DIAGNOSIS — M9905 Segmental and somatic dysfunction of pelvic region: Secondary | ICD-10-CM

## 2022-10-12 DIAGNOSIS — M9908 Segmental and somatic dysfunction of rib cage: Secondary | ICD-10-CM

## 2022-10-12 DIAGNOSIS — G4486 Cervicogenic headache: Secondary | ICD-10-CM | POA: Diagnosis not present

## 2022-10-12 DIAGNOSIS — M9903 Segmental and somatic dysfunction of lumbar region: Secondary | ICD-10-CM

## 2022-10-12 DIAGNOSIS — M9904 Segmental and somatic dysfunction of sacral region: Secondary | ICD-10-CM

## 2022-10-12 DIAGNOSIS — M9902 Segmental and somatic dysfunction of thoracic region: Secondary | ICD-10-CM

## 2022-10-12 DIAGNOSIS — M9901 Segmental and somatic dysfunction of cervical region: Secondary | ICD-10-CM

## 2022-10-12 NOTE — Patient Instructions (Signed)
Good to see you 2 tennis balls in a tube sock Stay active Shoulder back and down See me again in 6 weeks

## 2022-10-12 NOTE — Assessment & Plan Note (Signed)
Continues to have some mild cervicogenic headaches but has noticed that worsening diet changes has done relatively well overall.  Discussed with patient about icing regimen and home exercises, discussed with patient about continuing with posture and ergonomics.  Increase activity slowly.  Will follow-up again next 6 to 12 weeks

## 2022-11-04 ENCOUNTER — Other Ambulatory Visit: Payer: Self-pay | Admitting: Neurology

## 2022-11-15 ENCOUNTER — Telehealth: Payer: Self-pay | Admitting: Pharmacy Technician

## 2022-11-15 NOTE — Telephone Encounter (Signed)
Patient Advocate Encounter   Received notification that prior authorization for Ubrelvy 100MG  tablets is required.   PA submitted on 11/15/2022 Key [id number] Status is pending       Lyndel Safe, [place] Patient Advocate Specialist [facility] Patient Advocate Team Direct Number: [phone]  Fax: [phone]

## 2022-11-19 NOTE — Progress Notes (Unsigned)
[place] Phone: [phone] Subjective:   Jake Jake [name], am serving as a scribe for Dr. Hulan Saas.  I'm seeing this patient by the request  of:  Patient, Jake Jake [name]  CC: Headache, backache follow-up  [id number]  Jake Jake [name] is a 37 y.o. male coming in with complaint of back and neck pain. OMT 10/12/2022. Patient states that he has feels knots in R and L side of neck. His headaches are more frequent recently. Patient has been doing acupuncture and dry needling. Went twice last week.   Having some shooting pain in lateral aspect of R knee. Insidious onset when he was sitting a week ago.   Medications patient has been prescribed: None  Taking:         Reviewed prior external information including notes and imaging from previsou exam, outside providers and external EMR if available.   As well as notes that were available from care everywhere and other healthcare systems.  Past medical history, social, surgical and family history all reviewed in electronic medical record.  Jake [name] pertanent information unless stated regarding to the chief complaint.   Past Medical History:  Diagnosis Date   Hx of migraines    Seizures in newborn     Jake [name] Known Allergies   Review of Systems:  Jake [name]  visual changes, nausea, vomiting, diarrhea, constipation, dizziness, abdominal pain, skin rash, fevers, chills, night sweats, weight loss, swollen lymph nodes, body aches, joint swelling, chest pain, shortness of breath, mood changes. POSITIVE muscle aches, headache  Objective  Blood pressure 122/72, pulse (!) 117, height 5\' 6"  (1.676 m), weight 153 lb (69.4 kg), SpO2 97 %.   General: Jake [name] apparent distress alert and oriented x3 mood and affect normal, dressed appropriately.  HEENT: Pupils equal, extraocular movements intact  Respiratory: Patient's speak in full sentences and does not appear short of breath  Cardiovascular:  Jake [name] lower extremity edema, non tender, Jake [name] erythema  Patient does have significant tightness on the right side of the occipital area.  Patient does have more pain now at the left lower part of the cervical area.  Right parascapular area does have significant tightness is also noted.  Osteopathic findings  C2 flexed rotated and side bent right C6 flexed rotated and side bent left T5 extended rotated and side bent right inhaled rib T8 extended rotated and side bent left L2 flexed rotated and side bent right Sacrum right on right       Assessment and Plan:  Cervicogenic headache Patient does have significant tightness noted in the neck.  Patient did respond well to osteopathic manipulation again.  Discussed posture and ergonomics.  Has not been quite as active as he has been previously.  Hoping that that if he does start to become more active will do better.  Follow-up again in 6 to 8 weeks we discussed medication such as the cyclobenzaprine again.    Nonallopathic problems  Decision today to treat with OMT was based on Physical Exam  After verbal consent patient was treated with HVLA, ME, FPR techniques in cervical, rib, thoracic, lumbar, and sacral  areas  Patient tolerated the procedure well with improvement in symptoms  Patient given exercises, stretches and lifestyle modifications  See medications in patient instructions if given  Patient will follow up in 4-8 weeks    The above documentation has been reviewed and is accurate and complete Lyndal Pulley, DO  Note: This dictation was prepared with Dragon dictation along with smaller phrase technology. Any transcriptional errors that result from this process are unintentional.

## 2022-11-22 ENCOUNTER — Ambulatory Visit: Payer: BC Managed Care – PPO | Admitting: Family Medicine

## 2022-11-22 VITALS — BP 122/72 | HR 117 | Ht 66.0 in | Wt 153.0 lb

## 2022-11-22 DIAGNOSIS — M9901 Segmental and somatic dysfunction of cervical region: Secondary | ICD-10-CM | POA: Diagnosis not present

## 2022-11-22 DIAGNOSIS — G4486 Cervicogenic headache: Secondary | ICD-10-CM | POA: Diagnosis not present

## 2022-11-22 DIAGNOSIS — M9908 Segmental and somatic dysfunction of rib cage: Secondary | ICD-10-CM

## 2022-11-22 DIAGNOSIS — M9904 Segmental and somatic dysfunction of sacral region: Secondary | ICD-10-CM

## 2022-11-22 DIAGNOSIS — M9903 Segmental and somatic dysfunction of lumbar region: Secondary | ICD-10-CM

## 2022-11-22 DIAGNOSIS — M9902 Segmental and somatic dysfunction of thoracic region: Secondary | ICD-10-CM

## 2022-11-22 NOTE — Patient Instructions (Signed)
Knee exercises Thigh compression Machine weights only See me again in 5-6 weeks

## 2022-11-22 NOTE — Assessment & Plan Note (Addendum)
Patient does have significant tightness noted in the neck.  Patient did respond well to osteopathic manipulation again.  Discussed posture and ergonomics.  Has not been quite as active as he has been previously.  Hoping that that if he does start to become more active will do better.  Follow-up again in 6 to 8 weeks we discussed medication such as the cyclobenzaprine again.

## 2022-12-08 NOTE — Telephone Encounter (Signed)
[  2/20 12:34 PM] [name] Patient Advocate Encounter   Received notification from  that the request for prior authorization for Ubrelvy 100MG tablets  has been denied due to Per your health plan's criteria, this drug is covered if you meet the following: You are not taking this drug with another calcitonin gene-related peptide inhibitor for the short-term treatment of your condition. The information provided does not show that you meet the criteria listed above..     Please be advised we currently do not have a Pharmacist to review denials, therefore you will need to process appeals accordingly as needed. Thanks for your support at this time.     You may E-appeal option available, KEY: [id number]

## 2022-12-21 ENCOUNTER — Other Ambulatory Visit (HOSPITAL_COMMUNITY): Payer: Self-pay

## 2022-12-21 NOTE — Telephone Encounter (Signed)
Patient Advocate Encounter  Prior Authorization for Jake [name] '100MG'$  tablets has been approved through [employer].    Key: [id number]  Effective: 12-21-2022 to 12-21-2023

## 2022-12-21 NOTE — Telephone Encounter (Signed)
Patient Advocate Encounter   Received notification from MD that prior authorization resubmission is requested for Ubrelvy '100MG'$  tablets  Submitted: 12-21-2022 Key [id number]  Status is pending

## 2022-12-21 NOTE — Telephone Encounter (Signed)
LMOVM of approval.  

## 2022-12-23 NOTE — Progress Notes (Signed)
  [place] Phone: [phone] Subjective:    I'm seeing this patient by the request  of:  Patient, No Pcp Per  CC: Neck pain and headache follow-up  [id number]  Jake [name] is a 37 y.o. male coming in with complaint of back and neck pain. OMT 11/22/2022. Patient states that continues to make some improvement to slowly.  Patient has had more neck pain than usual because of some stress at work.  Medications patient has been prescribed: None  Taking:         Reviewed prior external information including notes and imaging from previsou exam, outside providers and external EMR if available.   As well as notes that were available from care everywhere and other healthcare systems.  Past medical history, social, surgical and family history all reviewed in electronic medical record.  No pertanent information unless stated regarding to the chief complaint.   Past Medical History:  Diagnosis Date   Hx of migraines    Seizures in newborn     No Known Allergies   Review of Systems:  No visual changes, nausea, vomiting, diarrhea, constipation, dizziness, abdominal pain, skin rash, fevers, chills, night sweats, weight loss, swollen lymph nodes, body aches, joint swelling, chest pain, shortness of breath, mood changes. POSITIVE muscle aches, headache  Objective  Blood pressure 120/84, pulse 76, height 5\' 6"  (1.676 m), weight 156 lb (70.8 kg), SpO2 97 %.   General: No apparent distress alert and oriented x3 mood and affect normal, dressed appropriately.  HEENT: Pupils equal, extraocular movements intact  Respiratory: Patient's speak in full sentences and does not appear short of breath  Cardiovascular: No lower extremity edema, non tender, no erythema  Neck exam does have some loss of lordosis.  Some tenderness to palpation in the paraspinal musculature.  Tightness with FABER test Monina of the lower back as  well.  Osteopathic findings  C3 flexed rotated and side bent right C5 flexed rotated and side bent left T3 extended rotated and side bent right inhaled rib T8 extended rotated and side bent left L2 flexed rotated and side bent right Sacrum right on right       Assessment and Plan:  Cervicogenic headache Cervicogenic headaches.  Discussed with patient icing regimen and home exercises, discussed obesity increases noted.  Patient is working on some of the stress and is may be considering a change in occupation.  Follow-up with me again in 6 to 8 weeks.  Total time with patient 31 minutes    Nonallopathic problems  Decision today to treat with OMT was based on Physical Exam  After verbal consent patient was treated with HVLA, ME, FPR techniques in cervical, rib, thoracic, lumbar, and sacral  areas  Patient tolerated the procedure well with improvement in symptoms  Patient given exercises, stretches and lifestyle modifications  See medications in patient instructions if given  Patient will follow up in 4-8 weeks     The above documentation has been reviewed and is accurate and complete Lyndal Pulley, DO         Note: This dictation was prepared with Dragon dictation along with smaller phrase technology. Any transcriptional errors that result from this process are unintentional.

## 2022-12-27 ENCOUNTER — Encounter: Payer: Self-pay | Admitting: Family Medicine

## 2022-12-27 ENCOUNTER — Ambulatory Visit: Payer: BC Managed Care – PPO | Admitting: Family Medicine

## 2022-12-27 VITALS — BP 120/84 | HR 76 | Ht 66.0 in | Wt 156.0 lb

## 2022-12-27 DIAGNOSIS — M9904 Segmental and somatic dysfunction of sacral region: Secondary | ICD-10-CM

## 2022-12-27 DIAGNOSIS — M9908 Segmental and somatic dysfunction of rib cage: Secondary | ICD-10-CM

## 2022-12-27 DIAGNOSIS — M9903 Segmental and somatic dysfunction of lumbar region: Secondary | ICD-10-CM

## 2022-12-27 DIAGNOSIS — M9902 Segmental and somatic dysfunction of thoracic region: Secondary | ICD-10-CM

## 2022-12-27 DIAGNOSIS — G4486 Cervicogenic headache: Secondary | ICD-10-CM | POA: Diagnosis not present

## 2022-12-27 DIAGNOSIS — M9901 Segmental and somatic dysfunction of cervical region: Secondary | ICD-10-CM | POA: Diagnosis not present

## 2022-12-27 NOTE — Patient Instructions (Signed)
Good to see you Don't lose the progress you are making  Think the hamstring is going to be okay  7-8 week follow up

## 2022-12-27 NOTE — Assessment & Plan Note (Signed)
Cervicogenic headaches.  Discussed with patient icing regimen and home exercises, discussed obesity increases noted.  Patient is working on some of the stress and is may be considering a change in occupation.  Follow-up with me again in 6 to 8 weeks.  Total time with patient 31 minutes

## 2022-12-30 ENCOUNTER — Other Ambulatory Visit: Payer: Self-pay | Admitting: Neurology

## 2023-01-10 ENCOUNTER — Other Ambulatory Visit: Payer: Self-pay | Admitting: Neurology

## 2023-01-10 NOTE — Telephone Encounter (Signed)
No further refills until seen.

## 2023-01-20 ENCOUNTER — Ambulatory Visit: Payer: BC Managed Care – PPO | Admitting: Neurology
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
## 2023-01-26 ENCOUNTER — Ambulatory Visit: Payer: Self-pay | Admitting: Neurology

## 2023-03-21 ENCOUNTER — Other Ambulatory Visit (HOSPITAL_COMMUNITY): Payer: Self-pay

## 2023-07-04 IMAGING — CT CT HEAD W/O CM
3 series · 14 of 47 positions shown, 16 images · non-contrast
Comparison: None.

CLINICAL DATA: Chronic headaches, increasing in frequency

EXAM:
CT HEAD WITHOUT CONTRAST
CT CERVICAL SPINE WITHOUT CONTRAST
TECHNIQUE: Multidetector CT imaging of the head and cervical spine was
performed following the standard protocol without intravenous
contrast. Multiplanar CT image reconstructions of the cervical spine
were also generated.

[Series 3: head 5.0 h30s · axial · 0.42mm/px · z∈[-57,+68]mm · 8 of 30 slices shown, 10 images]
[im 3/30  brain]
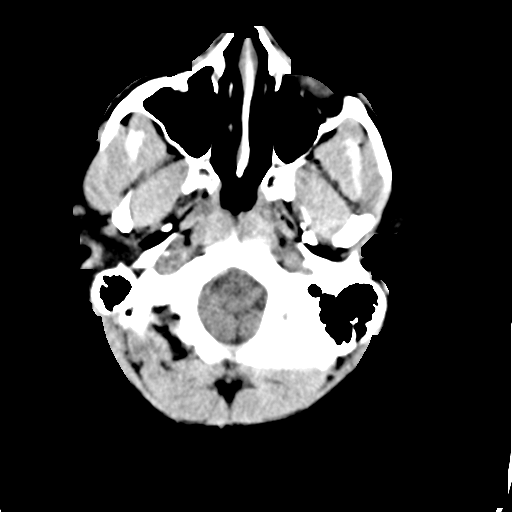
[im 3/30  bone]
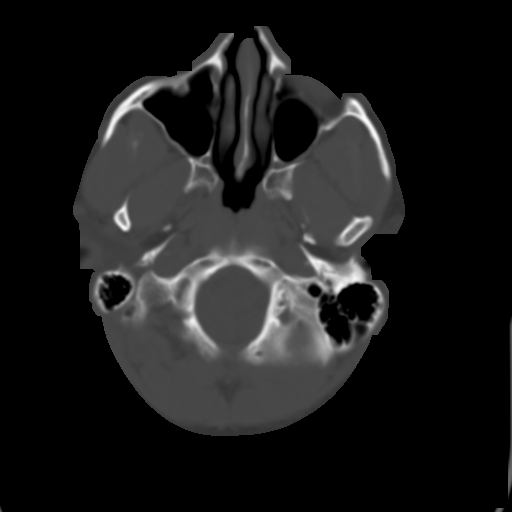
[im 7/30  brain]
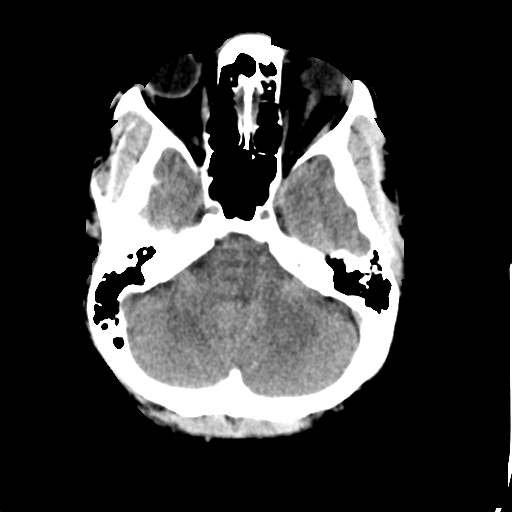
[im 10/30  brain]
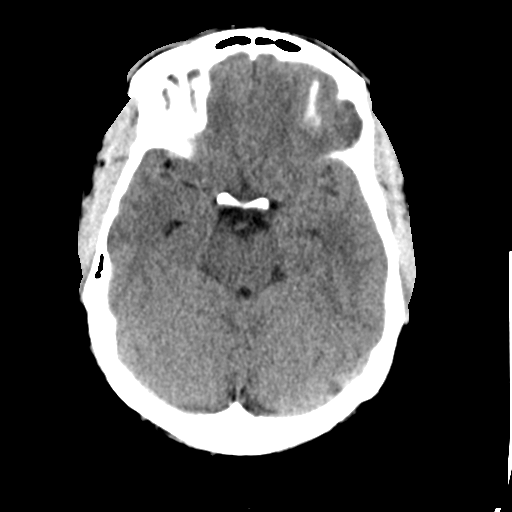
[im 14/30  brain]
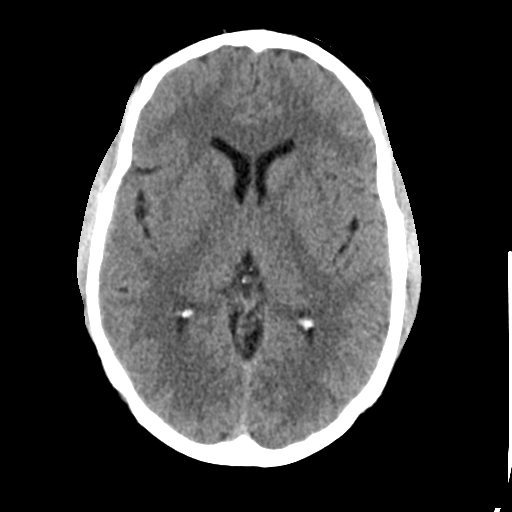
[im 17/30  brain]
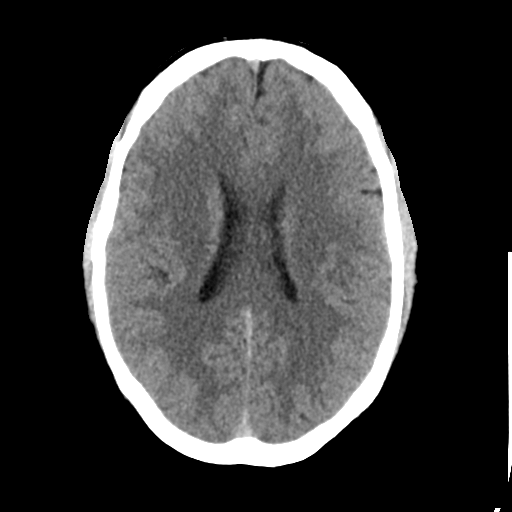
[im 17/30  bone]
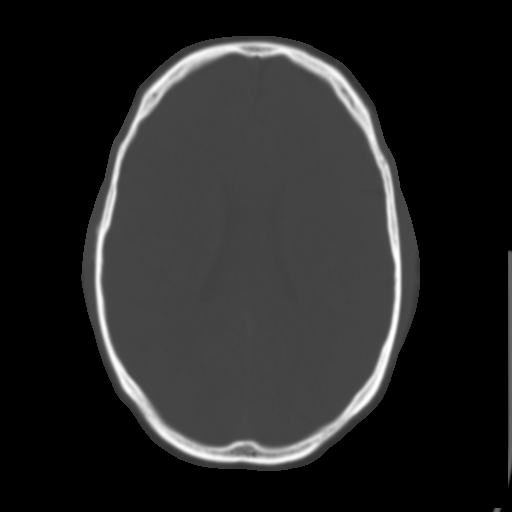
[im 21/30  brain]
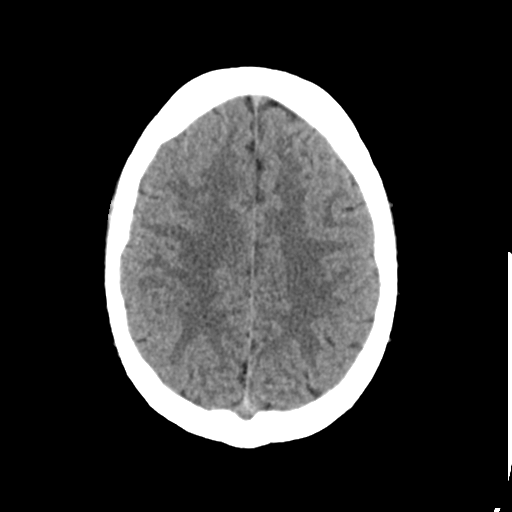
[im 24/30  brain]
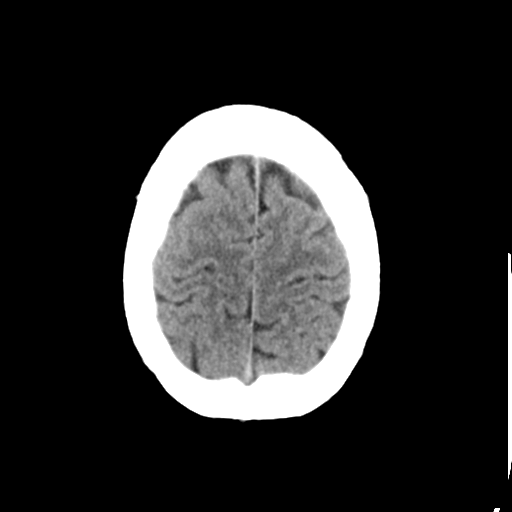
[im 28/30  brain]
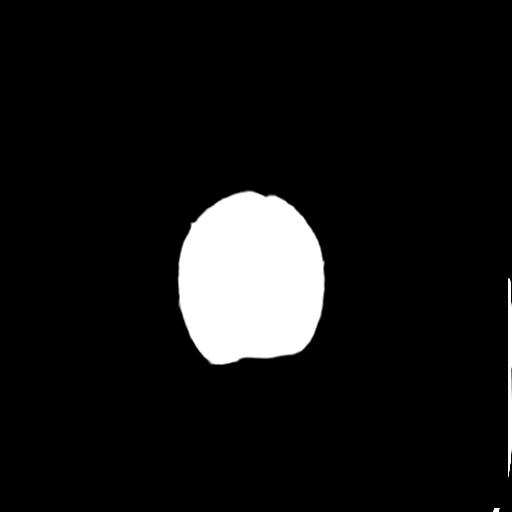

[Series 5: head 3.0 mpr sag · sagittal · 0.30mm/px · 3 of 67 slices shown]
[im 23/67  brain]
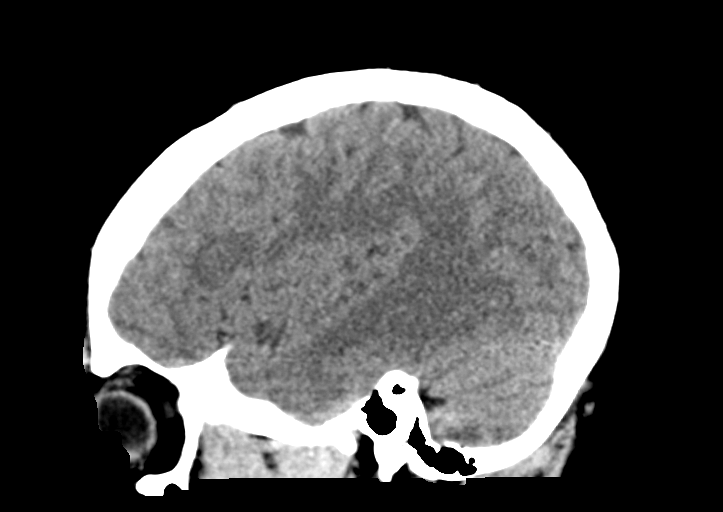
[im 34/67  brain]
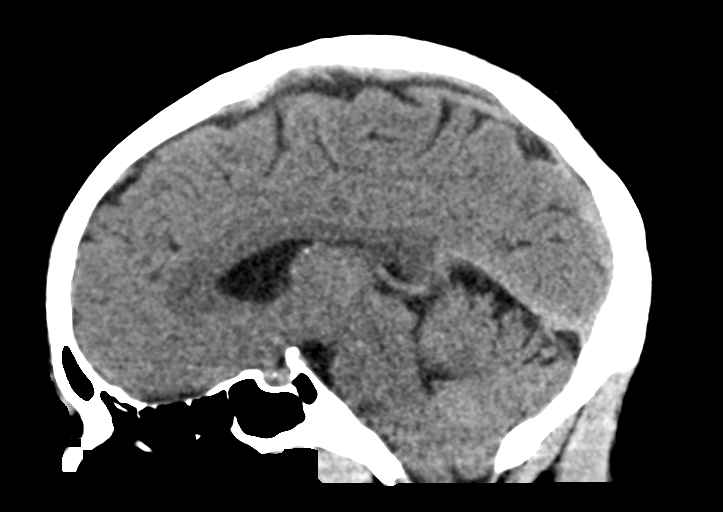
[im 45/67  brain]
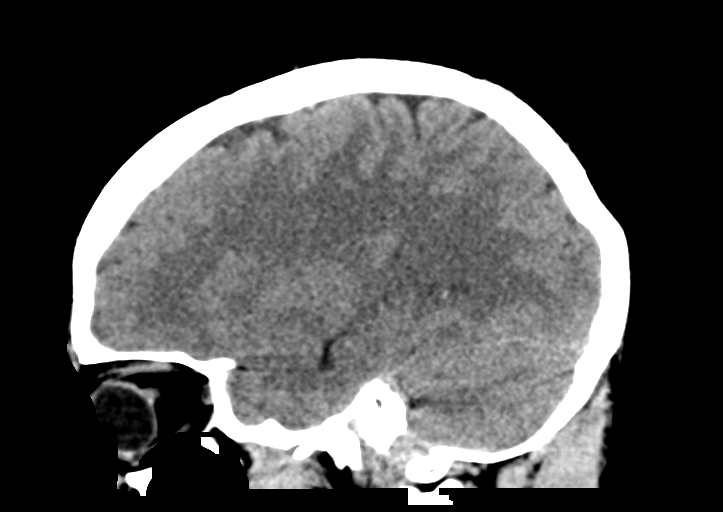

[Series 6: head 3.0 mpr cor · coronal · 0.30mm/px · 3 of 68 slices shown]
[im 23/68  brain]
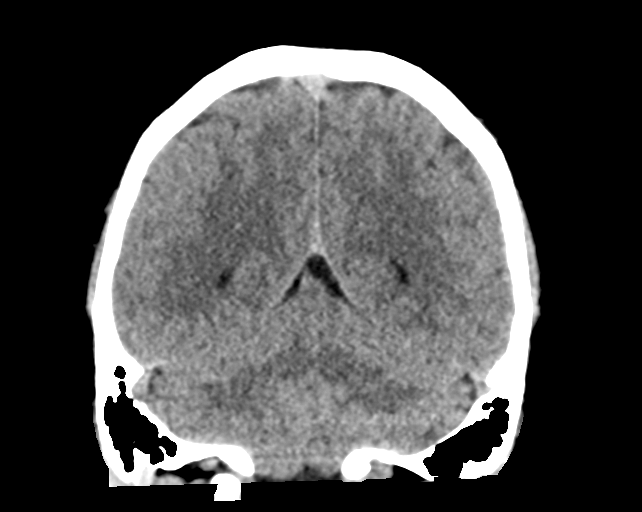
[im 30/68  brain]
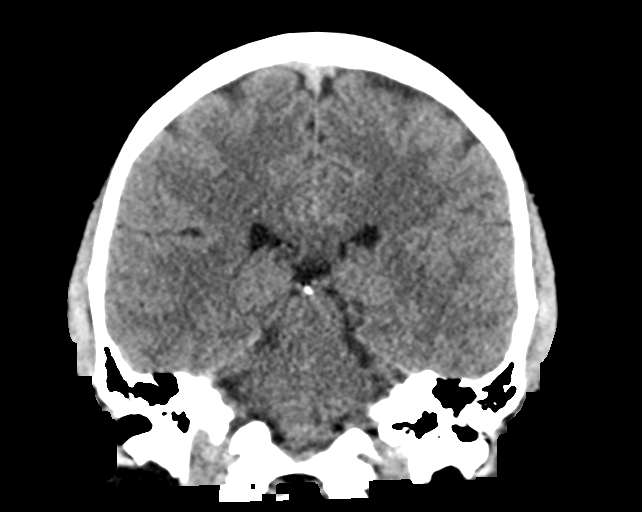
[im 38/68  brain]
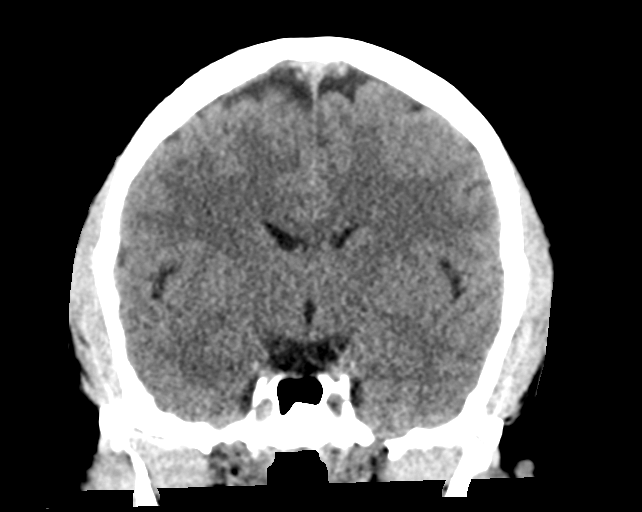

[14 of 47 positions shown; findings below may reference images not displayed]

FINDINGS: CT HEAD FINDINGS

Brain: No evidence of acute infarction, hemorrhage, hydrocephalus,
extra-axial collection or mass lesion/mass effect.

Vascular: No hyperdense vessel or unexpected calcification.

Skull: No osseous abnormality.

Sinuses/Orbits: Tiny mucous retention cyst in the left maxillary
sinus. Visualized mastoid sinuses are clear. Visualized orbits
demonstrate no focal abnormality.

Other: No fluid collection or hematoma.

CT CERVICAL SPINE FINDINGS

Alignment: Normal.

Skull base and vertebrae: No acute fracture. No primary bone lesion
or focal pathologic process.

Soft tissues and spinal canal: No prevertebral fluid or swelling. No
visible canal hematoma.

Disc levels:  Disc spaces are maintained. No foraminal narrowing.

Upper chest: Lung apices are clear.

Other: No fluid collection or hematoma.
IMPRESSION: 1. No acute intracranial pathology.
2.  No acute osseous injury of the cervical spine.

## 2023-07-04 IMAGING — CT CT CERVICAL SPINE W/O CM
3 of 4 series · 11 of 33 positions shown, 13 images · non-contrast
Comparison: None.

CLINICAL DATA: Chronic headaches, increasing in frequency

EXAM:
CT HEAD WITHOUT CONTRAST
CT CERVICAL SPINE WITHOUT CONTRAST
TECHNIQUE: Multidetector CT imaging of the head and cervical spine was
performed following the standard protocol without intravenous
contrast. Multiplanar CT image reconstructions of the cervical spine
were also generated.

[Series 5: c_spine 2.0 st · axial · 0.32mm/px · z∈[-216,-64]mm · 3 of 115 slices shown, 4 images]
[im 20/115  soft-tissue]
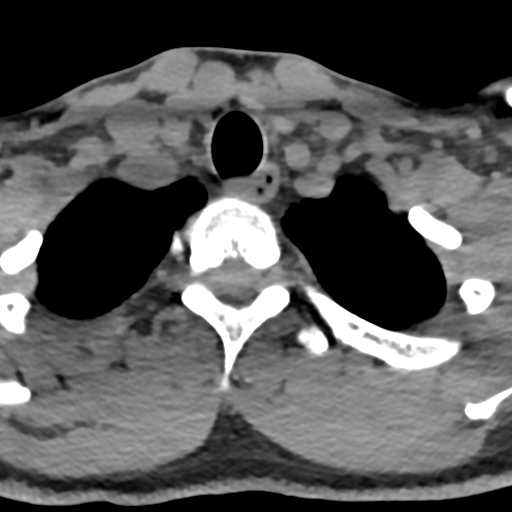
[im 20/115  bone]
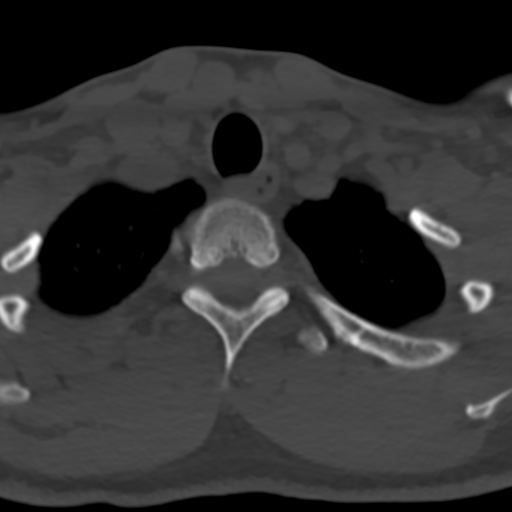
[im 58/115  bone]
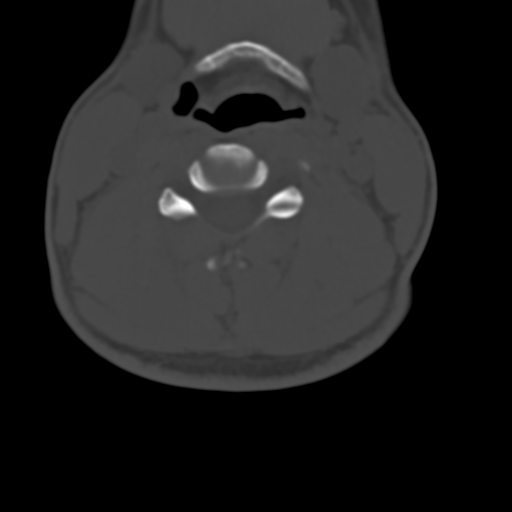
[im 96/115  bone]
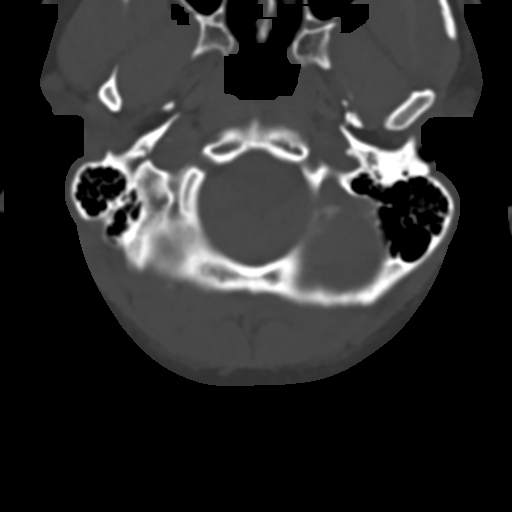

[Series 8: sagittal bone · sagittal · 0.29mm/px · 5 of 61 slices shown, 6 images]
[im 21/61  bone]
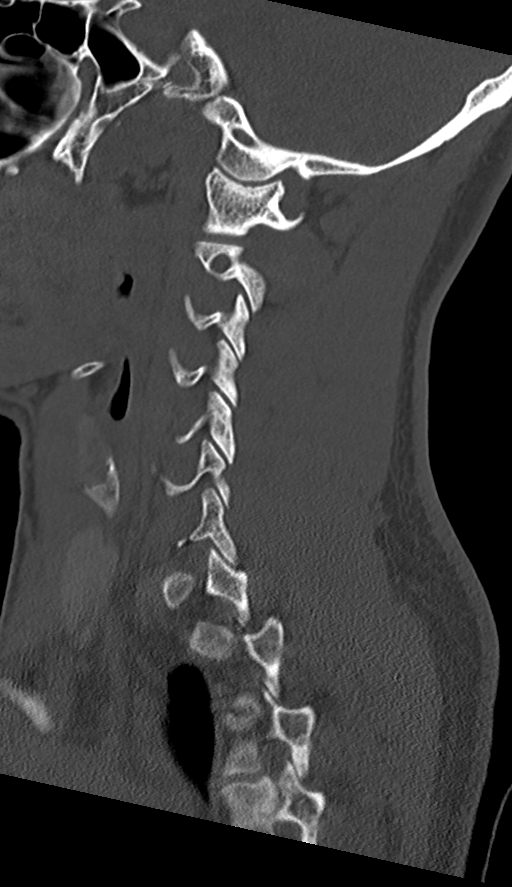
[im 26/61  bone]
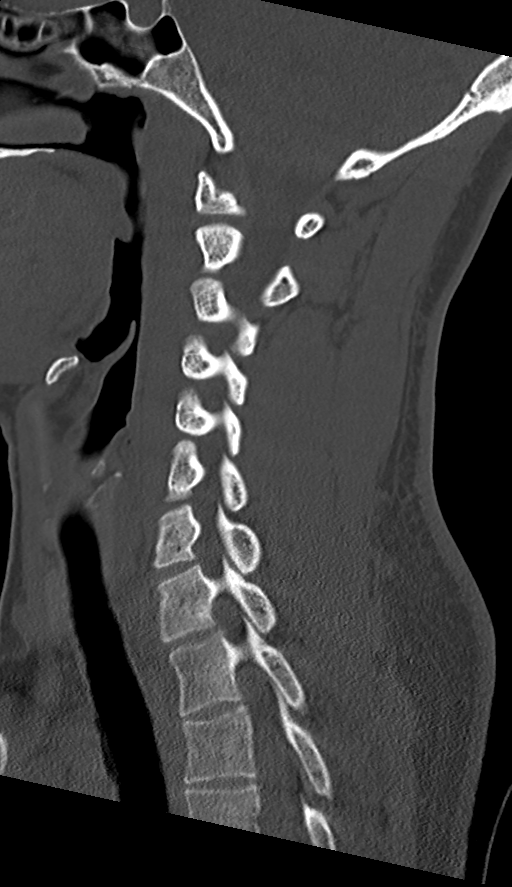
[im 31/61  soft-tissue]
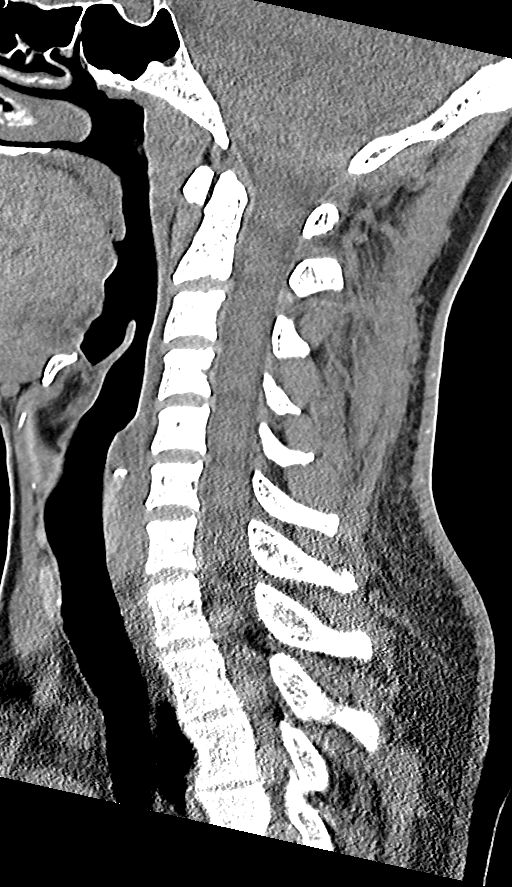
[im 31/61  bone]
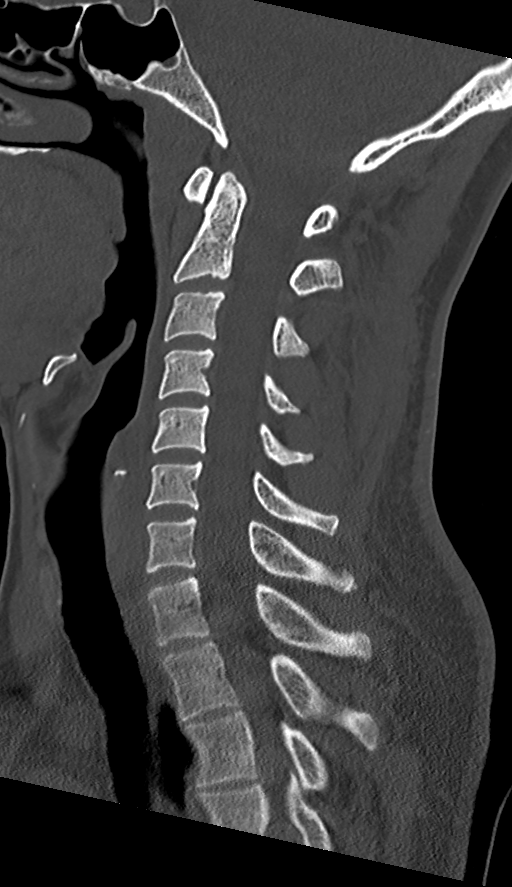
[im 36/61  bone]
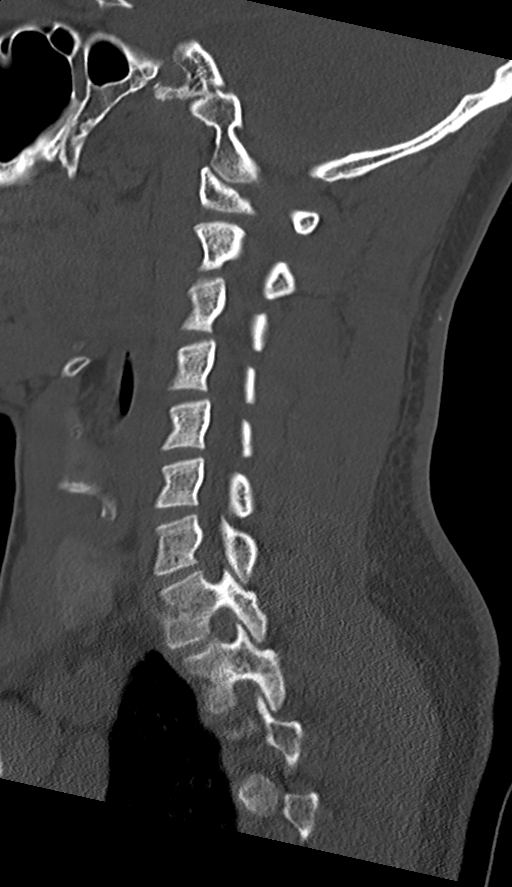
[im 41/61  bone]
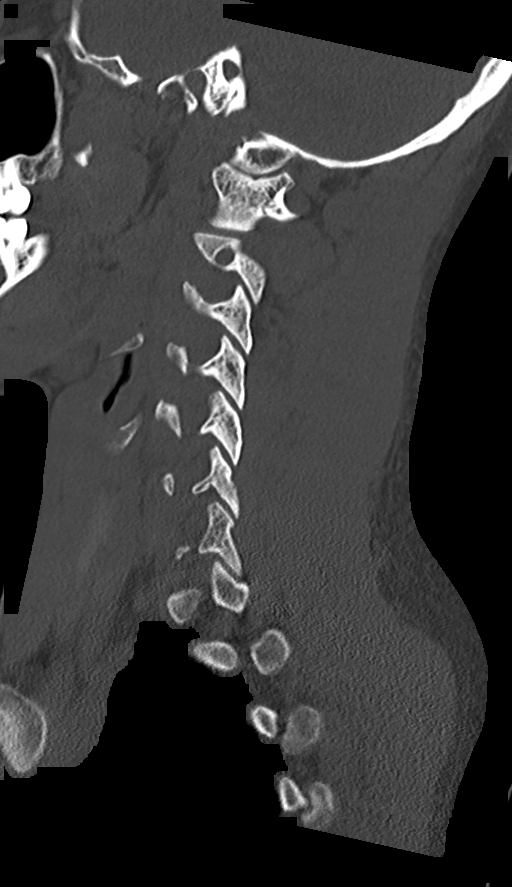

[Series 10: coronal bone · coronal · 0.29mm/px · 3 of 61 slices shown]
[im 13/61  bone]
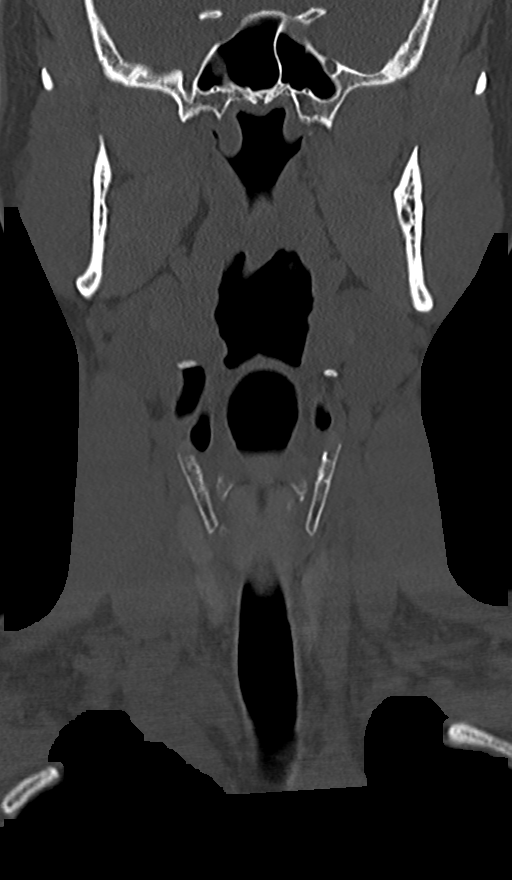
[im 25/61  bone]
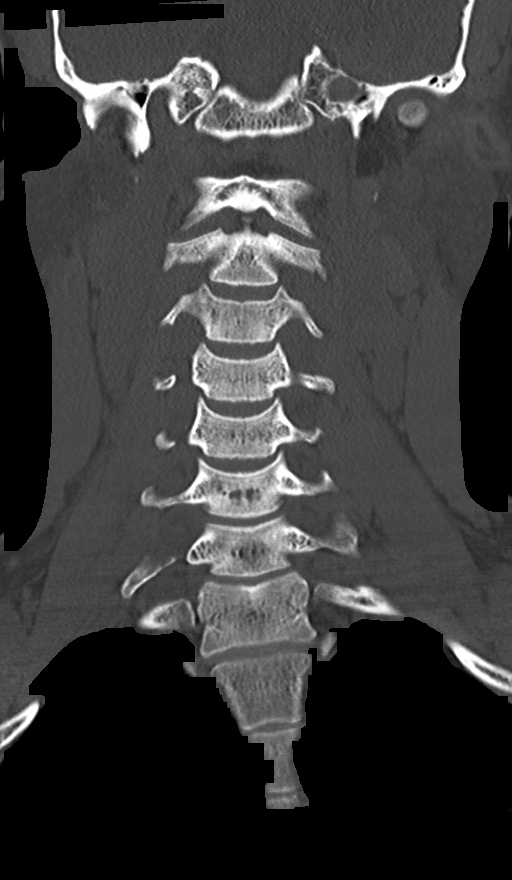
[im 37/61  bone]
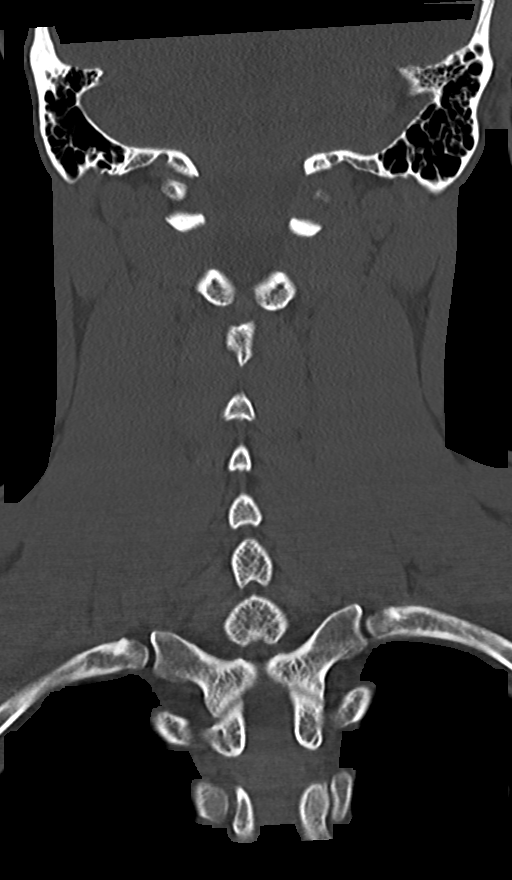

[11 of 33 positions shown; findings below may reference images not displayed]

FINDINGS: CT HEAD FINDINGS

Brain: No evidence of acute infarction, hemorrhage, hydrocephalus,
extra-axial collection or mass lesion/mass effect.

Vascular: No hyperdense vessel or unexpected calcification.

Skull: No osseous abnormality.

Sinuses/Orbits: Tiny mucous retention cyst in the left maxillary
sinus. Visualized mastoid sinuses are clear. Visualized orbits
demonstrate no focal abnormality.

Other: No fluid collection or hematoma.

CT CERVICAL SPINE FINDINGS

Alignment: Normal.

Skull base and vertebrae: No acute fracture. No primary bone lesion
or focal pathologic process.

Soft tissues and spinal canal: No prevertebral fluid or swelling. No
visible canal hematoma.

Disc levels:  Disc spaces are maintained. No foraminal narrowing.

Upper chest: Lung apices are clear.

Other: No fluid collection or hematoma.
IMPRESSION: 1. No acute intracranial pathology.
2.  No acute osseous injury of the cervical spine.

## 2023-11-24 ENCOUNTER — Telehealth: Payer: Self-pay | Admitting: Pharmacy Technician

## 2023-11-24 NOTE — Telephone Encounter (Signed)
Pharmacy Patient Advocate Encounter   Received notification from CoverMyMeds that prior authorization for UBRELVY 100MG  is required/requested.   Insurance verification completed.   The patient is insured through [facility] .   Per test claim: PA required; PA submitted to above mentioned insurance via CoverMyMeds Key/confirmation #/EOC [id number] Status is pending

## 2024-01-24 NOTE — Telephone Encounter (Signed)
 Pharmacy Patient Advocate Encounter  Received notification from [employer] that Prior Authorization for UBRELVY 100MG  has been APPROVED from 2.13.25 to 2.13.26   PA #/Case ID/Reference #: [id number]

## 2024-07-05 NOTE — Progress Notes (Deleted)
  [name] [address] [place] [address] Phone: [phone] Subjective:    I'm seeing this patient by the request  of:  Patient, No Pcp Per  CC:   [name]  Jake [name] is a 38 y.o. male coming in with complaint of back and neck pain. OMT March 2024. Patient states   Medications patient has been prescribed: None  Taking:         Reviewed prior external information including notes and imaging from previsou exam, outside providers and external EMR if available.   As well as notes that were available from care everywhere and other healthcare systems.  Past medical history, social, surgical and family history all reviewed in electronic medical record.  No pertanent information unless stated regarding to the chief complaint.   Past Medical History:  Diagnosis Date   Hx of migraines    Seizures in newborn     No Known Allergies   Review of Systems:  No headache, visual changes, nausea, vomiting, diarrhea, constipation, dizziness, abdominal pain, skin rash, fevers, chills, night sweats, weight loss, swollen lymph nodes, body aches, joint swelling, chest pain, shortness of breath, mood changes. POSITIVE muscle aches  Objective  There were no vitals taken for this visit.   General: No apparent distress alert and oriented x3 mood and affect normal, dressed appropriately.  HEENT: Pupils equal, extraocular movements intact  Respiratory: Patient's speak in full sentences and does not appear short of breath  Cardiovascular: No lower extremity edema, non tender, no erythema  Gait MSK:  Back   Osteopathic findings  C2 flexed rotated and side bent right C6 flexed rotated and side bent left T3 extended rotated and side bent right inhaled rib T9 extended rotated and side bent left L2 flexed rotated and side bent right Sacrum right on right       Assessment and Plan:  No problem-specific Assessment & Plan notes found for  this encounter.    Nonallopathic problems  Decision today to treat with OMT was based on Physical Exam  After verbal consent patient was treated with HVLA, ME, FPR techniques in cervical, rib, thoracic, lumbar, and sacral  areas  Patient tolerated the procedure well with improvement in symptoms  Patient given exercises, stretches and lifestyle modifications  See medications in patient instructions if given  Patient will follow up in 4-8 weeks             Note: This dictation was prepared with Dragon dictation along with smaller phrase technology. Any transcriptional errors that result from this process are unintentional.

## 2024-07-10 ENCOUNTER — Ambulatory Visit: Admitting: Family Medicine

## 2024-07-10 ENCOUNTER — Ambulatory Visit: Admitting: Sports Medicine

## 2024-07-10 VITALS — HR 77 | Ht 66.0 in | Wt 137.0 lb

## 2024-07-10 DIAGNOSIS — M542 Cervicalgia: Secondary | ICD-10-CM | POA: Diagnosis not present

## 2024-07-10 DIAGNOSIS — G4486 Cervicogenic headache: Secondary | ICD-10-CM

## 2024-07-10 DIAGNOSIS — M9901 Segmental and somatic dysfunction of cervical region: Secondary | ICD-10-CM

## 2024-07-10 DIAGNOSIS — M9908 Segmental and somatic dysfunction of rib cage: Secondary | ICD-10-CM | POA: Diagnosis not present

## 2024-07-10 DIAGNOSIS — M9902 Segmental and somatic dysfunction of thoracic region: Secondary | ICD-10-CM

## 2024-07-10 MED ORDER — CYCLOBENZAPRINE HCL 10 MG PO TABS: 10.0000 mg | ORAL_TABLET | Freq: Every day | ORAL | 5 refills | Status: AC

## 2024-07-10 NOTE — Patient Instructions (Addendum)
 Flexeril  refill   3 week follow up

## 2024-07-10 NOTE — Progress Notes (Addendum)
 Jake [name] [name] [address] [place] [address] Phone: [phone]   Assessment and Plan:     1. Cervicogenic headache (Primary) 2. Neck pain 3. Somatic dysfunction of cervical region 4. Somatic dysfunction of thoracic region 5. Somatic dysfunction of rib region -Chronic with exacerbation, subsequent sports medicine visit - Continued neck pain, cervicogenic headaches, migraines.  Patient was having improvements with OMT treatments, as needed Flexeril , as needed topiramate , Aimovig  injections with neurology, however patient changed jobs and lost his insurance, so has been lost to follow-up for the past 1.5 years.  Presents today to reestablish care - Mus Flexeril  5-10 mg nightly as needed for muscle spasms - Restart HEP for neck and trapezius - Reestablish care with neurology.  Appointment scheduled for October 2025 - Patient has received relief with OMT in the past.  Elects for repeat OMT today.  Tolerated well per note below. - Decision today to treat with OMT was based on Physical Exam  After verbal consent patient was treated with HVLA (high velocity low amplitude), ME (muscle energy), FPR (flex positional release), ST (soft tissue),   techniques in cervical, rib, thoracic,  areas. Patient tolerated the procedure well with improvement in symptoms.  Patient educated on potential side effects of soreness and recommended to rest, hydrate, and use Tylenol  as needed for pain control.   Pertinent previous records reviewed include none  15 additional minutes spent for educating Therapeutic Home Exercise Program.  This included exercises focusing on stretching, strengthening, with focus on eccentric aspects.   Long term goals include an improvement in range of motion, strength, endurance as well as avoiding reinjury. Patient's frequency would include in 1-2 times a day, 3-5 times a week for a duration of 6-12 weeks. Proper technique shown and  discussed handout in great detail with ATC.  All questions were discussed and answered.    Follow Up: 3 weeks for reevaluation.  Could consider repeat OMT   Subjective:   I, Jake [name], am serving as a Neurosurgeon for Doctor Jake [name]  Chief Complaint: OMT   HPI:   12/27/2022 Jake [name] is a 38 y.o. male coming in with complaint of back and neck pain. OMT 11/22/2022. Patient states that continues to make some improvement to slowly.  Patient has had more neck pain than usual because of some stress at work.   07/10/24 Patient states migraines are really bad at the moment. Neck  and upper trap are flared. Aimovig  helped and would like a refill or something similar he does have appointment Oct . Decreased ROM. Rotation causes migraines    Relevant Historical Information: None pertinent  Additional pertinent review of systems negative.   Current Outpatient Medications:    cyclobenzaprine  (FLEXERIL ) 10 MG tablet, Take 1 tablet (10 mg total) by mouth at bedtime., Disp: 30 tablet, Rfl: 5   Dihydroergotamine Mesylate  HFA (TRUDHESA ) 0.725 MG/ACT AERS, 1 spray in each nostril.  May repeat once after 1 hour., Disp: 8 mL, Rfl: 11   Erenumab -aooe (AIMOVIG ) 140 MG/ML SOAJ, ADMINISTER 140 MG(1 ML) UNDER THE SKIN EVERY 30 DAYS, Disp: 1 mL, Rfl: 11   Ubrogepant  (UBRELVY ) 100 MG TABS, TAKE 1 TABLET BY MOUTH AS NEEDED (MAY REPEAT AFTER 2 HOURS. MAXIMUM 2 TABLETS IN 24 HOURS)., Disp: 10 tablet, Rfl: 0   Objective:     Vitals:   07/10/24 1528  Pulse: 77  SpO2: 98%  Weight: 137 lb (62.1 kg)  Height: 5' 6 (1.676 m)  Body mass index is 22.11 kg/m.    Physical Exam:    General: Well-appearing, cooperative, sitting comfortably in no acute distress.   OMT Physical Exam:  Cervical: TTP paraspinal, C3-5 RLSL Rib: Bilateral elevated first rib with TTP Thoracic: TTP paraspinal, T3-5 RRSL, T6 RLSL     Electronically signed by:  Jake [name] [name] 3:53  PM 07/10/24

## 2024-07-30 NOTE — Progress Notes (Unsigned)
 [name] [address] [place] [address] Phone: [phone]   Assessment and Plan:     1. Cervicogenic headache (Primary) 2. Neck pain 3. Somatic dysfunction of cervical region 4. Somatic dysfunction of thoracic region 5. Somatic dysfunction of rib region -Chronic with exacerbation, subsequent visit - Overall improvement in neck pain and decreased frequency in headaches/migraines, however patient still is experiencing intermittent moderate to severe headaches/migraines -May continue to use Flexeril  5 to 10 mg nightly as needed for muscle spasms - Continue HEP for neck and trapezius.  Recommend stretching before and after workouts - Patient has a reestablishing visit with neurology tomorrow - Patient has received relief with OMT in the past.  Elects for repeat OMT today.  Tolerated well per note below. - Decision today to treat with OMT was based on Physical Exam  After verbal consent patient was treated with HVLA (high velocity low amplitude), ME (muscle energy), FPR (flex positional release), ST (soft tissue),  techniques in cervical, rib, thoracic,  areas. Patient tolerated the procedure well with improvement in symptoms.  Patient educated on potential side effects of soreness and recommended to rest, hydrate, and use Tylenol  as needed for pain control.   Pertinent previous records reviewed include none   Follow Up: 4 weeks for reevaluation.  Could consider repeat OMT   Subjective:   I, Jake [name], am serving as a Neurosurgeon for Doctor Jake [name]   Chief Complaint: OMT    HPI:    12/27/2022 Jake [name] is a 38 y.o. male coming in with complaint of back and neck pain. OMT 11/22/2022. Patient states that continues to make some improvement to slowly.  Patient has had more neck pain than usual because of some stress at work.   07/10/24 Patient states migraines are really bad at the moment. Neck  and upper trap  are flared. Aimovig  helped and would like a refill or something similar he does have appointment Oct . Decreased ROM. Rotation causes migraines   07/31/2024 Patient states intermittent headaches. Muscles relaxer's have helped some. Has only had about 7 headaches. 5 moderate and 2 severe. Pressure in the back of the neck has decreased      Relevant Historical Information: None pertinent    Additional pertinent review of systems negative.   Current Outpatient Medications:    cyclobenzaprine  (FLEXERIL ) 10 MG tablet, Take 1 tablet (10 mg total) by mouth at bedtime., Disp: 30 tablet, Rfl: 5   Dihydroergotamine Mesylate  HFA (TRUDHESA ) 0.725 MG/ACT AERS, 1 spray in each nostril.  May repeat once after 1 hour., Disp: 8 mL, Rfl: 11   Erenumab -aooe (AIMOVIG ) 140 MG/ML SOAJ, ADMINISTER 140 MG(1 ML) UNDER THE SKIN EVERY 30 DAYS, Disp: 1 mL, Rfl: 11   Ubrogepant  (UBRELVY ) 100 MG TABS, TAKE 1 TABLET BY MOUTH AS NEEDED (MAY REPEAT AFTER 2 HOURS. MAXIMUM 2 TABLETS IN 24 HOURS)., Disp: 10 tablet, Rfl: 0   Objective:     Vitals:   07/31/24 1305  Pulse: 92  SpO2: 98%  Weight: 170 lb (77.1 kg)  Height: 5' 6 (1.676 m)      Body mass index is 27.44 kg/m.    Physical Exam:    General: Well-appearing, cooperative, sitting comfortably in no acute distress.   OMT Physical Exam:   Cervical: TTP paraspinal, C4 RLSR Rib: Bilateral elevated first rib with TTP Thoracic: TTP paraspinal and trapezius bilaterally, T6 RLSL   Electronically signed by:  Jake [name] D.CLEMENTEEN [name] 1:26 PM 07/31/24

## 2024-07-31 ENCOUNTER — Ambulatory Visit: Admitting: Sports Medicine

## 2024-07-31 VITALS — HR 92 | Ht 66.0 in | Wt 170.0 lb

## 2024-07-31 DIAGNOSIS — G4486 Cervicogenic headache: Secondary | ICD-10-CM

## 2024-07-31 DIAGNOSIS — M9902 Segmental and somatic dysfunction of thoracic region: Secondary | ICD-10-CM

## 2024-07-31 DIAGNOSIS — M9901 Segmental and somatic dysfunction of cervical region: Secondary | ICD-10-CM | POA: Diagnosis not present

## 2024-07-31 DIAGNOSIS — M542 Cervicalgia: Secondary | ICD-10-CM | POA: Diagnosis not present

## 2024-07-31 DIAGNOSIS — M9908 Segmental and somatic dysfunction of rib cage: Secondary | ICD-10-CM

## 2024-07-31 NOTE — Patient Instructions (Signed)
 Continue muscle relaxer's as needed   4 week follow up   Continue HEP  Remember to stretch before and after workouts
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
## 2024-08-01 ENCOUNTER — Telehealth: Payer: Self-pay | Admitting: Neurology

## 2024-08-01 ENCOUNTER — Ambulatory Visit: Admitting: Neurology

## 2024-08-01 ENCOUNTER — Encounter: Payer: Self-pay | Admitting: Neurology

## 2024-08-01 VITALS — BP 125/79 | HR 103 | Ht 66.0 in | Wt 167.0 lb

## 2024-08-01 DIAGNOSIS — M542 Cervicalgia: Secondary | ICD-10-CM | POA: Diagnosis not present

## 2024-08-01 DIAGNOSIS — G43009 Migraine without aura, not intractable, without status migrainosus: Secondary | ICD-10-CM

## 2024-08-01 MED ORDER — UBRELVY 100 MG PO TABS: 1.0000 | ORAL_TABLET | ORAL | 5 refills | Status: AC | PRN

## 2024-08-01 MED ORDER — AIMOVIG 140 MG/ML ~~LOC~~ SOAJ: SUBCUTANEOUS | 11 refills | Status: AC

## 2024-08-01 NOTE — Telephone Encounter (Signed)
 Per pharmacy its a insurance issue.   PA team can we check to see if it a PA needed.

## 2024-08-01 NOTE — Telephone Encounter (Signed)
 Pt called this afternoon. Pt stated that [facility] filled the prescription for the Aimovig , but they would not fill the prescription called: Ubrogepant  (UBRELVY ) 100 MG TABS. Pt stated , can the order for the UBRELVY  go to [facility] [address] in [place].  Thanks

## 2024-08-01 NOTE — Patient Instructions (Signed)
 Restart Aimovig  140mg  every 30 days Restart Ubrelvy  - take as needed Cyclobenzaprine  for neck pain as needed Limit use of pain relievers to no more than 9 days out of the month to prevent risk of rebound or medication-overuse headache. Keep headache diary Follow up 7 months.

## 2024-08-06 ENCOUNTER — Encounter: Payer: Self-pay | Admitting: Neurology

## 2024-08-06 ENCOUNTER — Telehealth: Payer: Self-pay | Admitting: Pharmacy Technician

## 2024-08-06 ENCOUNTER — Other Ambulatory Visit (HOSPITAL_COMMUNITY): Payer: Self-pay

## 2024-08-06 NOTE — Telephone Encounter (Signed)
 Pharmacy Patient Advocate Encounter   Received notification from Pt Calls Messages that prior authorization for UBRELVY  100MG  is required/requested.   Insurance verification completed.   The patient is insured through [facility].   Per test claim: PA required; PA submitted to above mentioned insurance via Latent Key/confirmation #/EOC [facility] Status is pending

## 2024-08-06 NOTE — Telephone Encounter (Signed)
 PA has been submitted, and telephone encounter has been created. Please see telephone encounter dated 10.27.25.

## 2024-08-08 ENCOUNTER — Other Ambulatory Visit (HOSPITAL_COMMUNITY): Payer: Self-pay

## 2024-08-08 ENCOUNTER — Telehealth: Payer: Self-pay | Admitting: Pharmacy Technician

## 2024-08-08 NOTE — Telephone Encounter (Signed)
 Pharmacy Patient Advocate Encounter   Received notification from Patient Advice Request messages that prior authorization for AIMOVIG  140MG  is required/requested.   Insurance verification completed.   The patient is insured through [facility] MEDICAID.   Per test claim: PA required; PA submitted to above mentioned insurance via Latent Key/confirmation #/EOC [id number] Status is pending

## 2024-08-08 NOTE — Telephone Encounter (Signed)
 PA has been submitted, and telephone encounter has been created. Please see telephone encounter dated 10.29.25.

## 2024-08-10 ENCOUNTER — Other Ambulatory Visit (HOSPITAL_COMMUNITY): Payer: Self-pay

## 2024-08-10 NOTE — Telephone Encounter (Signed)
 Pharmacy Patient Advocate Encounter  Received notification from [facility] MEDICAID that Prior Authorization for UBRELVY  100MG  has been DENIED.  Full denial letter will be uploaded to the media tab. See denial reason below.   PA #/Case ID/Reference #: [id number]

## 2024-08-10 NOTE — Telephone Encounter (Signed)
 Pharmacy Patient Advocate Encounter  Received notification from [facility] MEDICAID that Prior Authorization for AIMOVIG  140MG  has been APPROVED from 10.29.25 to 10.29.26. Ran test claim, Copay is $4. This test claim was processed through [facility]- copay amounts may vary at other pharmacies due to pharmacy/plan contracts, or as the patient moves through the different stages of their insurance plan.   PA #/Case ID/Reference #: [id number]

## 2024-08-14 ENCOUNTER — Other Ambulatory Visit (HOSPITAL_COMMUNITY): Payer: Self-pay

## 2024-08-14 ENCOUNTER — Telehealth: Payer: Self-pay | Admitting: Pharmacy Technician

## 2024-08-14 MED ORDER — ELETRIPTAN HYDROBROMIDE 40 MG PO TABS: 40.0000 mg | ORAL_TABLET | ORAL | 0 refills | Status: DC | PRN

## 2024-08-14 NOTE — Telephone Encounter (Signed)
 Pt insurance will not allow the use of two CGRP's, although only one is use for preventative. PA has been submitted for Eletriptan.

## 2024-08-14 NOTE — Telephone Encounter (Signed)
 Patient okay with Eletriptan 40 mg. Script sent to [facility] as requested.

## 2024-08-14 NOTE — Telephone Encounter (Signed)
 Pharmacy Patient Advocate Encounter   Received notification from Pt Calls Messages that prior authorization for ELETRIPTAN 40MG  is required/requested.   Insurance verification completed.   The patient is insured through [facility] MEDICAID.   Per test claim: PA required; PA submitted to above mentioned insurance via Latent Key/confirmation #/EOC [facility] Status is pending

## 2024-08-14 NOTE — Telephone Encounter (Signed)
 Pharmacy Patient Advocate Encounter  Received notification from [facility] MEDICAID that Prior Authorization for ELETRIPTAN 40MG  has been APPROVED from 11.4.25 to 11.4.26. Ran test claim, Copay is $4. This test claim was processed through [facility]- copay amounts may vary at other pharmacies due to pharmacy/plan contracts, or as the patient moves through the different stages of their insurance plan.   PA #/Case ID/Reference #: [id number]

## 2024-08-14 NOTE — Telephone Encounter (Signed)
 Appeal will not reverse denial decision with this insurance plan. PA still pending for Eletriptan.

## 2024-08-27 NOTE — Progress Notes (Unsigned)
 Ben Honor D.CLEMENTEEN [facility] [address] [place] [address] Phone: [phone]   Assessment and Plan:     1. Cervicogenic headache (Primary) 2. Neck pain 3. Somatic dysfunction of cervical region 4. Somatic dysfunction of thoracic region 6. Rib cage region somatic dysfunction -Chronic with exacerbation, subsequent visit - Mild improvement in symptoms, however patient still experiencing intermittent severe headaches/migraines, neck stiffness and pain - Patient reestablished with neurology and has restarted multiple medications for chronic headaches/migraines.  Recommend continuing follow-up with neurology - Continue HEP for neck and trapezius - Start physical therapy and could consider dry needling.  Referral sent - Continue Flexeril  5 to 10 mg nightly as needed for muscle spasms - Patient has received relief with OMT in the past.  Elects for repeat OMT today.  Tolerated well per note below. - Decision today to treat with OMT was based on Physical Exam  After verbal consent patient was treated with HVLA (high velocity low amplitude), ME (muscle energy), FPR (flex positional release), ST (soft tissue),   techniques in cervical, rib, thoracic,   areas. Patient tolerated the procedure well with improvement in symptoms.  Patient educated on potential side effects of soreness and recommended to rest, hydrate, and use Tylenol  as needed for pain control.     Pertinent previous records reviewed include none   Follow Up: 4 weeks for reevaluation.  Could consider repeat OMT   Subjective:   I, Jake [name], am serving as a neurosurgeon for Doctor Morene Mace   Chief Complaint: OMT    HPI:    12/27/2022 Jake [name] is a 38 y.o. male coming in with complaint of back and neck pain. OMT 11/22/2022. Patient states that continues to make some improvement to slowly.  Patient has had more neck pain than usual because of some stress at work.    07/10/24 Patient states migraines are really bad at the moment. Neck  and upper trap are flared. Aimovig  helped and would like a refill or something similar he does have appointment Oct . Decreased ROM. Rotation causes migraines    07/31/2024 Patient states intermittent headaches. Muscles relaxer's have helped some. Has only had about 7 headaches. 5 moderate and 2 severe. Pressure in the back of the neck has decreased    08/28/2024 Patient states he is the same , neck is tight and giving him the most problems    Relevant Historical Information: None pertinent    Additional pertinent review of systems negative.   Current Outpatient Medications:    cyclobenzaprine  (FLEXERIL ) 10 MG tablet, Take 1 tablet (10 mg total) by mouth at bedtime., Disp: 30 tablet, Rfl: 5   Dihydroergotamine Mesylate  HFA (TRUDHESA ) 0.725 MG/ACT AERS, 1 spray in each nostril.  May repeat once after 1 hour. (Patient not taking: Reported on 08/01/2024), Disp: 8 mL, Rfl: 11   eletriptan (RELPAX) 40 MG tablet, Take 1 tablet (40 mg total) by mouth as needed for migraine or headache. May repeat in 2 hours if headache persists or recurs., Disp: 10 tablet, Rfl: 0   Erenumab -aooe (AIMOVIG ) 140 MG/ML SOAJ, ADMINISTER 140 MG(1 ML) UNDER THE SKIN EVERY 30 DAYS, Disp: 1 mL, Rfl: 11   Ubrogepant  (UBRELVY ) 100 MG TABS, Take 1 tablet (100 mg total) by mouth as needed (may repeat after 2 hours.  Maximum 2 tablets in 24 hours)., Disp: 10 tablet, Rfl: 5   Objective:     Vitals:   08/28/24 1319  Pulse: 93  SpO2: 97%  Weight: 168 lb (76.2 kg)  Height: 5' 6 (1.676 m)      Body mass index is 27.12 kg/m.    Physical Exam:    General: Well-appearing, cooperative, sitting comfortably in no acute distress.   OMT Physical exam Cervical: TTP paraspinal, C1 RRR, C3 RLSR Rib: Bilateral elevated first rib with TTP Thoracic: TTP paraspinal, T6 RRSR   Electronically signed by:  Odis Mace D.CLEMENTEEN [facility] 1:55  PM 08/28/24

## 2024-08-28 ENCOUNTER — Ambulatory Visit: Admitting: Sports Medicine

## 2024-08-28 VITALS — HR 93 | Ht 66.0 in | Wt 168.0 lb

## 2024-08-28 DIAGNOSIS — M9902 Segmental and somatic dysfunction of thoracic region: Secondary | ICD-10-CM

## 2024-08-28 DIAGNOSIS — M542 Cervicalgia: Secondary | ICD-10-CM

## 2024-08-28 DIAGNOSIS — M9908 Segmental and somatic dysfunction of rib cage: Secondary | ICD-10-CM

## 2024-08-28 DIAGNOSIS — G4486 Cervicogenic headache: Secondary | ICD-10-CM

## 2024-08-28 DIAGNOSIS — M9901 Segmental and somatic dysfunction of cervical region: Secondary | ICD-10-CM

## 2024-08-28 NOTE — Patient Instructions (Addendum)
PT referral  4 week follow up

## 2024-09-24 NOTE — Progress Notes (Deleted)
°   Jake [name] D.J[name] [address] [place] [address] Phone: [phone]   Assessment and Plan:     *** - Patient has received relief with OMT in the past.  Elects for repeat OMT today.  Tolerated well per note below. - Decision today to treat with OMT was based on Physical Exam   After verbal consent patient was treated with HVLA (high velocity low amplitude), ME (muscle energy), FPR (flex positional release), ST (soft tissue), PC/PD (Pelvic Compression/ Pelvic Decompression) techniques in cervical, rib, thoracic, lumbar, and pelvic areas. Patient tolerated the procedure well with improvement in symptoms.  Patient educated on potential side effects of soreness and recommended to rest, hydrate, and use Tylenol  as needed for pain control.   Pertinent previous records reviewed include ***    Follow Up: ***     Subjective:   I, Jake [name], am serving as a neurosurgeon for Doctor Jake [name]   Chief Complaint: OMT    HPI:    12/27/2022 Jake [name] is a 38 y.o. male coming in with complaint of back and neck pain. OMT 11/22/2022. Patient states that continues to make some improvement to slowly.  Patient has had more neck pain than usual because of some stress at work.   07/10/24 Patient states migraines are really bad at the moment. Neck  and upper trap are flared. [facility]ovig  helped and would like a refill or something similar he does have appointment Oct . Decreased ROM. Rotation causes migraines    07/31/2024 Patient states intermittent headaches. Muscles relaxer's have helped some. Has only had about 7 headaches. 5 moderate and 2 severe. Pressure in the back of the neck has decreased    08/28/2024 Patient states he is the same , neck is tight and giving him the most problems   09/25/2024 Patient states    Relevant Historical Information: None pertinent    Additional pertinent review of systems negative.  Current Outpatient Medications   Medication Sig Dispense Refill   cyclobenzaprine  (FLEXERIL ) 10 MG tablet Take 1 tablet (10 mg total) by mouth at bedtime. 30 tablet 5   Dihydroergotamine Mesylate  HFA (TRUDHESA ) 0.725 MG/ACT AERS 1 spray in each nostril.  May repeat once after 1 hour. (Patient not taking: Reported on 08/01/2024) 8 mL 11   eletriptan  (RELPAX ) 40 MG tablet Take 1 tablet (40 mg total) by mouth as needed for migraine or headache. May repeat in 2 hours if headache persists or recurs. 10 tablet 0   Erenumab -aooe (AIMOVIG ) 140 MG/ML SOAJ ADMINISTER 140 MG(1 ML) UNDER THE SKIN EVERY 30 DAYS 1 mL 11   Ubrogepant  (UBRELVY ) 100 MG TABS Take 1 tablet (100 mg total) by mouth as needed (may repeat after 2 hours.  Maximum 2 tablets in 24 hours). 10 tablet 5   No current facility-administered medications for this visit.      Objective:     There were no vitals filed for this visit.    There is no height or weight on file to calculate BMI.    Physical Exam:     General: Well-appearing, cooperative, sitting comfortably in no acute distress.   OMT Physical Exam:  ASIS Compression Test: Positive Right Cervical: TTP paraspinal, *** Rib: Bilateral elevated first rib with TTP Thoracic: TTP paraspinal,*** Lumbar: TTP paraspinal,*** Pelvis: Right anterior innominate  Electronically signed by:  Odis [name] D.Jake [name] 1:05 PM 09/24/2024

## 2024-09-25 ENCOUNTER — Ambulatory Visit: Admitting: Sports Medicine

## 2024-10-19 ENCOUNTER — Other Ambulatory Visit: Payer: Self-pay | Admitting: Neurology

## 2024-10-29 NOTE — Progress Notes (Unsigned)
 "  [name] [address] [place] [address] Phone: [phone]   Assessment and Plan:     1. Cervicogenic headache (Primary) 2. Neck pain 3. Somatic dysfunction of cervical region 4. Somatic dysfunction of thoracic region 5. Somatic dysfunction of rib region -Chronic with exacerbation, subsequent visit - Recurrent flare of neck pain, headaches since previous office visit -Continue neurology follow-up -Continue HEP for neck and trapezius - Referral sent for physical therapy at previous office visit, will check with patient at follow-up to monitor progress - Continue Flexeril  5 to 10 mg nightly as needed for muscle spasms - Patient has received relief with OMT in the past.  Elects for repeat OMT today.  Tolerated well per note below. - Decision today to treat with OMT was based on Physical Exam   After verbal consent patient was treated with HVLA (high velocity low amplitude), ME (muscle energy), FPR (flex positional release), ST (soft tissue),   techniques in cervical, rib, thoracic,   areas. Patient tolerated the procedure well with improvement in symptoms.  Patient educated on potential side effects of soreness and recommended to rest, hydrate, and use Tylenol  as needed for pain control.   6. Injury of triangular fibrocartilage complex (TFCC) of left wrist, initial encounter -Acute, initial visit - Consistent with sprain of left TFCC based on left wrist pain with physical activity, weight lifting - Recommend relative rest from weightlifting with lower weights, low repetitions.  Encouraged to lock out wrist to avoid ulnar deviation at wrist to stop TFCC impingement - May use topical Voltaren  gel over area of pain  Pertinent previous records reviewed include none  Follow Up: 4 weeks for reevaluation.  Could consider repeat OMT.  Would review wrist pain   Subjective:   I, Jake [name], am serving as a neurosurgeon for Doctor Morene Mace   Chief Complaint: OMT    HPI:    12/27/2022 Jake [name] is a 39 y.o. male coming in with complaint of back and neck pain. OMT 11/22/2022. Patient states that continues to make some improvement to slowly.  Patient has had more neck pain than usual because of some stress at work.   07/10/24 Patient states migraines are really bad at the moment. Neck  and upper trap are flared. Aimovig  helped and would like a refill or something similar he does have appointment Oct . Decreased ROM. Rotation causes migraines    07/31/2024 Patient states intermittent headaches. Muscles relaxer's have helped some. Has only had about 7 headaches. 5 moderate and 2 severe. Pressure in the back of the neck has decreased    08/28/2024 Patient states he is the same , neck is tight and giving him the most problems   10/30/2024 Patient states headaches started coming back end of December/ a couple of weeks ago.    Relevant Historical Information: None pertinent  Additional pertinent review of systems negative.  Current Outpatient Medications  Medication Sig Dispense Refill   cyclobenzaprine  (FLEXERIL ) 10 MG tablet Take 1 tablet (10 mg total) by mouth at bedtime. 30 tablet 5   Dihydroergotamine Mesylate  HFA (TRUDHESA ) 0.725 MG/ACT AERS 1 spray in each nostril.  May repeat once after 1 hour. (Patient not taking: Reported on 08/01/2024) 8 mL 11   eletriptan  (RELPAX ) 40 MG tablet TAKE 1 TABLET BY MOUTH AS NEEDED FOR MIGRAINE OR HEADACHE. CAN REPEAT IN 2 HOURS IF HEADACHE PERSISTS OR REOCCURS. MAX 2 PER DAY 10 tablet 0   Erenumab -aooe (AIMOVIG )  140 MG/ML SOAJ ADMINISTER 140 MG(1 ML) UNDER THE SKIN EVERY 30 DAYS 1 mL 11   Ubrogepant  (UBRELVY ) 100 MG TABS Take 1 tablet (100 mg total) by mouth as needed (may repeat after 2 hours.  Maximum 2 tablets in 24 hours). 10 tablet 5   No current facility-administered medications for this visit.      Objective:     Vitals:   10/30/24 0846  BP: 120/82  Weight: 162  lb (73.5 kg)  Height: 5' 6 (1.676 m)      Body mass index is 26.15 kg/m.    Physical Exam:     General: Well-appearing, cooperative, sitting comfortably in no acute distress.   OMT Physical Exam:  ASIS Compression Test: Positive Right Cervical: TTP paraspinal, C4 RRSL Rib: Bilateral elevated first rib with TTP Thoracic: TTP paraspinal, T4-6 RRSL    Left hand/Wrist:   No deformity or swelling appreciated. ROM  Ext 90, flexion 70, radial/ulnar deviation 30 with pain with ulnar deviation nttp over the snuff box, dorsal carpals, volar carpals, radial styloid, ulnar styloid, 1st mcp, tfcc Negative Tinel's, Phalen's, Prayer Tests Negative finklestein Positive tfcc bounce test No pain with resisted ext, flex or deviation   Electronically signed by:  [name] 9:10 AM 10/30/24 "

## 2024-10-30 ENCOUNTER — Ambulatory Visit: Admitting: Sports Medicine

## 2024-10-30 VITALS — BP 120/82 | Ht 66.0 in | Wt 162.0 lb

## 2024-10-30 DIAGNOSIS — M9908 Segmental and somatic dysfunction of rib cage: Secondary | ICD-10-CM

## 2024-10-30 DIAGNOSIS — S6982XA Other specified injuries of left wrist, hand and finger(s), initial encounter: Secondary | ICD-10-CM

## 2024-10-30 DIAGNOSIS — M542 Cervicalgia: Secondary | ICD-10-CM | POA: Diagnosis not present

## 2024-10-30 DIAGNOSIS — M9901 Segmental and somatic dysfunction of cervical region: Secondary | ICD-10-CM | POA: Diagnosis not present

## 2024-10-30 DIAGNOSIS — G4486 Cervicogenic headache: Secondary | ICD-10-CM

## 2024-10-30 DIAGNOSIS — M9902 Segmental and somatic dysfunction of thoracic region: Secondary | ICD-10-CM | POA: Diagnosis not present

## 2024-10-31 ENCOUNTER — Ambulatory Visit: Admitting: Sports Medicine

## 2024-11-09 ENCOUNTER — Encounter: Payer: Self-pay | Admitting: Neurology

## 2024-11-09 MED ORDER — ELETRIPTAN HYDROBROMIDE 40 MG PO TABS: 40.0000 mg | ORAL_TABLET | ORAL | 5 refills | Status: AC | PRN

## 2024-11-27 ENCOUNTER — Ambulatory Visit: Admitting: Sports Medicine

## 2025-03-11 ENCOUNTER — Ambulatory Visit: Admitting: Neurology
